# Patient Record
Sex: Male | Born: 1960 | Race: White | Hispanic: No | Marital: Single | Smoking: Former smoker
Health system: Southern US, Community
[De-identification: ages and names within clinical notes are randomized; demographics above are authoritative.]

## PROBLEM LIST (undated history)

## (undated) DIAGNOSIS — F411 Generalized anxiety disorder: Secondary | ICD-10-CM

## (undated) DIAGNOSIS — I10 Essential (primary) hypertension: Secondary | ICD-10-CM

## (undated) DIAGNOSIS — G119 Hereditary ataxia, unspecified: Secondary | ICD-10-CM

## (undated) DIAGNOSIS — F988 Other specified behavioral and emotional disorders with onset usually occurring in childhood and adolescence: Secondary | ICD-10-CM

## (undated) DIAGNOSIS — E669 Obesity, unspecified: Secondary | ICD-10-CM

## (undated) DIAGNOSIS — K589 Irritable bowel syndrome without diarrhea: Secondary | ICD-10-CM

## (undated) DIAGNOSIS — N4403 Torsion of appendix testis: Secondary | ICD-10-CM

## (undated) HISTORY — PX: COLOSTOMY: SHX63

## (undated) HISTORY — DX: Essential (primary) hypertension: I10

## (undated) HISTORY — DX: Torsion of appendix testis: N44.03

## (undated) HISTORY — DX: Generalized anxiety disorder: F41.1

## (undated) HISTORY — DX: Other specified behavioral and emotional disorders with onset usually occurring in childhood and adolescence: F98.8

## (undated) HISTORY — DX: Irritable bowel syndrome, unspecified: K58.9

## (undated) HISTORY — DX: Obesity, unspecified: E66.9

---

## 1999-08-13 ENCOUNTER — Encounter: Payer: Self-pay | Admitting: Family Medicine

## 1999-08-13 ENCOUNTER — Encounter: Admission: RE | Admit: 1999-08-13 | Discharge: 1999-08-13 | Payer: Self-pay | Admitting: Family Medicine

## 2008-08-19 ENCOUNTER — Encounter: Admission: RE | Admit: 2008-08-19 | Discharge: 2008-08-19 | Payer: Self-pay | Admitting: Family Medicine

## 2008-11-08 ENCOUNTER — Encounter: Admission: RE | Admit: 2008-11-08 | Discharge: 2008-11-08 | Payer: Self-pay | Admitting: Neurology

## 2009-09-24 ENCOUNTER — Encounter: Payer: Self-pay | Admitting: Cardiology

## 2011-11-04 ENCOUNTER — Encounter: Payer: Self-pay | Admitting: *Deleted

## 2012-04-17 ENCOUNTER — Encounter: Payer: Self-pay | Admitting: Cardiology

## 2013-07-03 ENCOUNTER — Other Ambulatory Visit: Payer: Self-pay | Admitting: Neurology

## 2013-07-03 DIAGNOSIS — R519 Headache, unspecified: Secondary | ICD-10-CM

## 2013-07-03 DIAGNOSIS — R51 Headache: Principal | ICD-10-CM

## 2013-07-12 ENCOUNTER — Ambulatory Visit
Admission: RE | Admit: 2013-07-12 | Discharge: 2013-07-12 | Disposition: A | Discharge status: Home / Self Care | Payer: Medicare Other | Source: Ambulatory Visit | Attending: Neurology | Admitting: Neurology

## 2013-07-12 DIAGNOSIS — R519 Headache, unspecified: Secondary | ICD-10-CM

## 2013-07-12 DIAGNOSIS — R51 Headache: Principal | ICD-10-CM

## 2013-09-27 ENCOUNTER — Other Ambulatory Visit: Payer: Self-pay | Admitting: Pain Medicine

## 2013-09-27 DIAGNOSIS — M542 Cervicalgia: Secondary | ICD-10-CM

## 2013-09-27 DIAGNOSIS — M546 Pain in thoracic spine: Secondary | ICD-10-CM

## 2013-10-04 ENCOUNTER — Other Ambulatory Visit: Payer: Medicare Other

## 2013-10-07 ENCOUNTER — Ambulatory Visit
Admission: RE | Admit: 2013-10-07 | Discharge: 2013-10-07 | Disposition: A | Discharge status: Home / Self Care | Payer: Medicare Other | Source: Ambulatory Visit | Attending: Pain Medicine | Admitting: Pain Medicine

## 2013-10-07 ENCOUNTER — Encounter (INDEPENDENT_AMBULATORY_CARE_PROVIDER_SITE_OTHER): Payer: Self-pay

## 2013-10-07 DIAGNOSIS — M542 Cervicalgia: Secondary | ICD-10-CM

## 2013-10-07 DIAGNOSIS — M546 Pain in thoracic spine: Secondary | ICD-10-CM

## 2013-11-02 ENCOUNTER — Other Ambulatory Visit (HOSPITAL_COMMUNITY): Payer: Medicare Other

## 2013-11-12 ENCOUNTER — Other Ambulatory Visit (HOSPITAL_COMMUNITY): Payer: Self-pay | Admitting: Cardiology

## 2013-11-12 DIAGNOSIS — G459 Transient cerebral ischemic attack, unspecified: Secondary | ICD-10-CM

## 2013-11-15 ENCOUNTER — Other Ambulatory Visit (HOSPITAL_COMMUNITY): Payer: Self-pay | Admitting: Radiology

## 2013-11-15 ENCOUNTER — Ambulatory Visit (HOSPITAL_COMMUNITY): Payer: Medicare Other | Attending: Cardiovascular Disease | Admitting: Radiology

## 2013-11-15 ENCOUNTER — Ambulatory Visit (HOSPITAL_COMMUNITY): Payer: Medicare Other | Attending: Cardiovascular Disease | Admitting: Cardiology

## 2013-11-15 DIAGNOSIS — I10 Essential (primary) hypertension: Secondary | ICD-10-CM | POA: Insufficient documentation

## 2013-11-15 DIAGNOSIS — E785 Hyperlipidemia, unspecified: Secondary | ICD-10-CM | POA: Insufficient documentation

## 2013-11-15 DIAGNOSIS — R42 Dizziness and giddiness: Secondary | ICD-10-CM

## 2013-11-15 DIAGNOSIS — I6529 Occlusion and stenosis of unspecified carotid artery: Secondary | ICD-10-CM

## 2013-11-15 DIAGNOSIS — G459 Transient cerebral ischemic attack, unspecified: Secondary | ICD-10-CM

## 2013-11-15 NOTE — Progress Notes (Signed)
Carotid duplex complete 

## 2013-11-15 NOTE — Progress Notes (Signed)
Echocardiogram performed.  

## 2017-01-08 ENCOUNTER — Encounter (HOSPITAL_COMMUNITY): Payer: Self-pay

## 2017-01-08 ENCOUNTER — Emergency Department (HOSPITAL_COMMUNITY)
Admission: EM | Admit: 2017-01-08 | Discharge: 2017-01-08 | Disposition: A | Discharge status: Home / Self Care | Payer: Medicare Other | Attending: Emergency Medicine | Admitting: Emergency Medicine

## 2017-01-08 DIAGNOSIS — I10 Essential (primary) hypertension: Secondary | ICD-10-CM | POA: Insufficient documentation

## 2017-01-08 DIAGNOSIS — E876 Hypokalemia: Secondary | ICD-10-CM | POA: Diagnosis not present

## 2017-01-08 DIAGNOSIS — Z79899 Other long term (current) drug therapy: Secondary | ICD-10-CM | POA: Diagnosis not present

## 2017-01-08 DIAGNOSIS — Z87891 Personal history of nicotine dependence: Secondary | ICD-10-CM | POA: Insufficient documentation

## 2017-01-08 DIAGNOSIS — E860 Dehydration: Secondary | ICD-10-CM | POA: Diagnosis not present

## 2017-01-08 DIAGNOSIS — G119 Hereditary ataxia, unspecified: Secondary | ICD-10-CM | POA: Insufficient documentation

## 2017-01-08 DIAGNOSIS — T679XXA Effect of heat and light, unspecified, initial encounter: Secondary | ICD-10-CM | POA: Diagnosis not present

## 2017-01-08 HISTORY — DX: Hereditary ataxia, unspecified: G11.9

## 2017-01-08 LAB — BASIC METABOLIC PANEL
Anion gap: 7 (ref 5–15)
BUN: 10 mg/dL (ref 6–20)
CO2: 20 mmol/L — ABNORMAL LOW (ref 22–32)
Calcium: 8.4 mg/dL — ABNORMAL LOW (ref 8.9–10.3)
Chloride: 115 mmol/L — ABNORMAL HIGH (ref 101–111)
Creatinine, Ser: 1.21 mg/dL (ref 0.61–1.24)
GFR calc Af Amer: >60 mL/min (ref >60)
GFR calc non Af Amer: >60 mL/min (ref >60)
Glucose, Bld: 120 mg/dL — ABNORMAL HIGH (ref 65–99)
Potassium: 3.1 mmol/L — ABNORMAL LOW (ref 3.5–5.1)
Sodium: 142 mmol/L (ref 135–145)

## 2017-01-08 LAB — CBC WITH DIFFERENTIAL/PLATELET
Basophils Absolute: 0 10*3/uL (ref 0.0–0.1)
Basophils Relative: 0 %
Eosinophils Absolute: 0.2 10*3/uL (ref 0.0–0.7)
Eosinophils Relative: 2 %
HCT: 39.4 % (ref 39.0–52.0)
Hemoglobin: 13.1 g/dL (ref 13.0–17.0)
Lymphocytes Relative: 15 %
Lymphs Abs: 1.3 10*3/uL (ref 0.7–4.0)
MCH: 27.7 pg (ref 26.0–34.0)
MCHC: 33.2 g/dL (ref 30.0–36.0)
MCV: 83.3 fL (ref 78.0–100.0)
Monocytes Absolute: 0.6 10*3/uL (ref 0.1–1.0)
Monocytes Relative: 7 %
Neutro Abs: 6.4 10*3/uL (ref 1.7–7.7)
Neutrophils Relative %: 76 %
Platelets: 132 10*3/uL — ABNORMAL LOW (ref 150–400)
RBC: 4.73 MIL/uL (ref 4.22–5.81)
RDW: 14.4 % (ref 11.5–15.5)
WBC: 8.4 10*3/uL (ref 4.0–10.5)

## 2017-01-08 LAB — CK: Total CK: 108 U/L (ref 49–397)

## 2017-01-08 LAB — I-STAT TROPONIN, ED: Troponin i, poc: 0 ng/mL (ref 0.00–0.08)

## 2017-01-08 MED ORDER — SODIUM CHLORIDE 0.9 % IV BOLUS (SEPSIS)
1000.0000 mL | Freq: Once | INTRAVENOUS | Status: AC
  Administered 2017-01-08: 1000 mL via INTRAVENOUS

## 2017-01-08 MED ORDER — POTASSIUM CHLORIDE CRYS ER 20 MEQ PO TBCR
40.0000 meq | EXTENDED_RELEASE_TABLET | Freq: Once | ORAL | Status: AC
  Administered 2017-01-08: 40 meq via ORAL
  Filled 2017-01-08: qty 2

## 2017-01-08 NOTE — Discharge Instructions (Signed)
Please make sure you stay well hydrated.  Please take breaks while working outside.  You should be urinating every 2-3 hours and it should be light yellow/clear.    Please follow up with your primary care doctor today regarding your symptoms and ED visit.    If your symptoms happen again or if you have any concerns or new abnormal symptoms please return to the ED.

## 2017-01-08 NOTE — ED Triage Notes (Signed)
He states he feels "weak". He further tells us he was "working outside since 7:30 this morning". He states he has a "brain problem" and this causes him to fall frequently. He states he fell today and could not get up for a period of time; but that he did not get hurt. He arrives in E.D. In no distress, having rec'd. 2 liters of IVF cn route to hospital.

## 2017-01-08 NOTE — ED Provider Notes (Signed)
[facility]-EMERGENCY DEPT Provider Note   CSN: [id number] Arrival date & time: 01/08/17  1520     History   Chief Complaint Chief Complaint  Patient presents with  . Heat Exposure    HPI Victor GeneraDouglas D Perman is a 56 y.o. male With a history Of cerebellar ataxia, chronic headaches, who presents by EMS after feeling like his legs gave out while he was outside. He reports that he had been intermittently working outside in the heat and was outside when his legs gave out and he "spiraled down". He was treated by EMS in the field with 2 L of fluids which he reports improved his symptoms greatly. He reports that he was well when he woke up this morning, with no recent fevers of chills.  He reports he has had his legs give out on him like this multiple times before.  He denies striking his head, no LOC.  Reports he has no pain right now, feels like his legs are back to normal.  He reports his blood pressure is normally about 130/80.    HPI  Past Medical History:  Diagnosis Date  . Anxiety state, unspecified   . 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 Cerebellar ataxia (HCC)   . Essential hypertension, benign   . IBS (irritable bowel syndrome)   . Obesity, unspecified   . Torsion of appendix testis     Patient Active Problem List   Diagnosis Date Noted  . Cerebellar ataxia [facility](HCC)     Past Surgical History:  Procedure Laterality Date  . COLOSTOMY     x2, abdomen ostomy colostomy       Home Medications    Prior to Admission medications   Medication Sig Start Date End Date Taking? Authorizing Provider  diltiazem (DILACOR XR) 180 MG 24 hr capsule Take 180 mg by mouth daily.    [provider]  divalproex (DEPAKOTE) 500 MG DR tablet Take 1,000 mg by mouth at bedtime.    [provider]  eletriptan (RELPAX) 40 MG tablet One tablet by mouth at onset of headache. May repeat in 2 hours if headache persists or recurs. may repeat in 2 hours if necessary     [provider]  HYDROcodone-acetaminophen (LORTAB) 10-500 MG per tablet Take 1 tablet by mouth every 6 (six) hours as needed.    [provider]  zolpidem (AMBIEN) 10 MG tablet Take 10 mg by mouth at bedtime as needed.    [provider]    Family History Family History  Problem Relation Age of Onset  . Cancer Unknown   . Diabetes Unknown   . Hypertension Unknown     Social History Social History  Substance Use Topics  . Smoking status: Former Games developermoker  . Smokeless tobacco: Never Used  . Alcohol use No     Allergies   Alcohol-sulfur [sulfur]   Review of Systems Review of Systems  Constitutional: Negative for chills and fever.  HENT: Negative for ear pain and sore throat.   Eyes: Negative for pain and visual disturbance (Reports vision is blurry, says it is normal for him as he doesn't have on his glasses. ).  Respiratory: Negative for cough and shortness of breath.   Cardiovascular: Negative for chest pain and palpitations.  Gastrointestinal: Negative for abdominal pain and vomiting.  Genitourinary: Negative for dysuria and hematuria.  Musculoskeletal: Negative for arthralgias and back pain.  Skin: Negative for color change and rash.  Neurological: Positive for weakness (Legs  gave out). Negative for seizures, syncope and headaches.  All other systems reviewed and are negative.    Physical Exam Updated Vital Signs BP 112/62 (BP Location: Right Arm)   Pulse 71   Temp 98.2 F (36.8 C) (Oral)   Resp 20   SpO2 100%   Physical Exam  Constitutional: He appears well-developed and well-nourished.  HENT:  Head: Normocephalic and atraumatic.  Eyes: Conjunctivae are normal.  Neck: Neck supple.  Cardiovascular: Normal rate and regular rhythm.   No murmur heard. Pulmonary/Chest: Effort normal and breath sounds normal. No respiratory distress.  Abdominal: Soft. There is no tenderness.  Musculoskeletal: He exhibits no edema, tenderness or  deformity.  5/5 strength to flexion and extension to ankle, knee, hip, wrists, elbows, shoulders.   Neurological: He is alert.  Mental Status:  Alert, oriented, thought content appropriate, able to give a coherent history. Speech fluent without evidence of aphasia. Able to follow 2 step commands without difficulty.  Cranial Nerves:  II:  Peripheral visual fields grossly normal, pupils equal, round, reactive to light, vision is normally blurry for patient as he doesn't have his glasses on.  III,IV, VI: ptosis not present, extra-ocular motions intact bilaterally  V,VII: smile symmetric, facial light touch sensation equal VIII: hearing grossly normal to voice  X: uvula elevates symmetrically  XI: bilateral shoulder shrug symmetric and strong XII: midline tongue extension without fassiculations Motor:  Normal tone. 5/5 in upper and lower extremities bilaterally including strong and equal grip strength and dorsiflexion/plantar flexion Cerebellar: Abnormal finger-to-nose with bilateral upper extremities, patient states is his normal CV: distal pulses palpable throughout    Skin: Skin is warm and dry.  Psychiatric: He has a normal mood and affect.  Nursing note and vitals reviewed.    ED Treatments / Results  Labs (all labs ordered are listed, but only abnormal results are displayed) Labs Reviewed  BASIC METABOLIC PANEL - Abnormal; Notable for the following:       Result Value   Potassium 3.1 (*)    Chloride 115 (*)    CO2 20 (*)    Glucose, Bld 120 (*)    Calcium 8.4 (*)    All other components within normal limits  CBC WITH DIFFERENTIAL/PLATELET - Abnormal; Notable for the following:    Platelets 132 (*)    All other components within normal limits  CK  I-STAT TROPONIN, ED    EKG  EKG Interpretation  Date/Time:  Saturday January 08 2017 16:02:18 EDT Ventricular Rate:  80 PR Interval:    QRS Duration: 98 QT Interval:  345 QTC Calculation: 398 R Axis:   52 Text  Interpretation:  Sinus rhythm Prolonged PR interval Abnormal R-wave progression, early transition Borderline T abnormalities, inferior leads Confirmed by Raeford RazorKohut, Stephen [phone]([id number]) on 01/08/2017 4:08:26 PM       Radiology No results found.  Procedures Procedures (including critical care time)  Medications Ordered in ED Medications  sodium chloride 0.9 % bolus 1,000 mL (0 mLs Intravenous Stopped 01/08/17 1905)  potassium chloride SA (K-DUR,KLOR-CON) CR tablet 40 mEq (40 mEq Oral Given 01/08/17 1703)     Initial Impression / Assessment and Plan / ED Course  I have reviewed the triage vital signs and the nursing notes.  Pertinent labs & imaging results that were available during my care of the patient were reviewed by me and considered in my medical decision making (see chart for details).  Clinical Course as of Jan 08 1918  Sat Jan 08, 2017  1741  Re-checked patient, says he feels better.  Vitals are improved.  Informed patient once he is able to urinate will  discharge him.   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  He reports he fells fine and is ready to go home.  He was given strict return precautions and states his understanding. He was instructed to follow up with his PCP.   [EH]    Clinical Course User Index [EH] Cristina Gong, PA-C   Victor [name] presents after having his legs give out while working in the heat.  He was initially hypotensive in the field and received 2 liters normal saline PTA.  He remained Hypotensive and I gave him a third liter of fluid.  He did not strike his head or loose consciousness and is not complaining of any pain at time of my evaluation.  Neruo exam normal for patient. Labs were obtained and reviewed, potassium was repleated.  After third liter and PO fluids patient was able to urinate.  Patient stated he was ready to go home.  Patient still remains asymptomatic.  He was given strict discharge precautions and states  his understanding.  Patient was given the option to ask questions, all of which were answered to the best of my ability.   At this time there does not appear to be any evidence of an acute emergency medical condition and the patient appears stable for discharge with appropriate outpatient follow up.Diagnosis was discussed with patient who verbalizes understanding and is agreeable to discharge. Pt case discussed with Dr. Fredderick Phenix who agrees with my plan.     Final Clinical Impressions(s) / ED Diagnoses   Final diagnoses:  Dehydration  Heat exposure, initial encounter  Hypokalemia    New Prescriptions New Prescriptions   No medications on file     Norman Clay 01/08/17 Ardis Rowan, MD 01/08/17 [phone]

## 2017-01-08 NOTE — ED Notes (Signed)
He remains in no distress; and his family are with him. He is oriented x 4 with somewhat slurred speech, which he states is his norm. He takes his med without difficulty.

## 2017-03-10 ENCOUNTER — Encounter: Payer: Self-pay | Admitting: Neurology

## 2017-05-16 ENCOUNTER — Ambulatory Visit: Payer: Medicare Other | Admitting: Neurology

## 2017-05-16 ENCOUNTER — Encounter: Payer: Self-pay | Admitting: Neurology

## 2017-05-16 VITALS — BP 92/56 | HR 85 | Ht 75.0 in | Wt 247.4 lb

## 2017-05-16 DIAGNOSIS — G44229 Chronic tension-type headache, not intractable: Secondary | ICD-10-CM

## 2017-05-16 DIAGNOSIS — G118 Other hereditary ataxias: Secondary | ICD-10-CM

## 2017-05-16 MED ORDER — ZONISAMIDE 100 MG PO CAPS: 100.0000 mg | ORAL_CAPSULE | Freq: Every day | ORAL | 2 refills | Status: DC

## 2017-05-16 NOTE — Progress Notes (Signed)
NEUROLOGY CONSULTATION NOTE  Victor [name] MRN: [id number] DOB: [birth year]  Referring provider: Charmayne SheerEric Moser, MD Primary care provider: Benedetto GoadFred Wilson, MD  Reason for consult:  papilledema  HISTORY OF PRESENT ILLNESS: Victor [name] Gravely is a 56 year old right-handed male with migraines, ADD, hypertension, depression/anxiety, fibromyalgia, IBS, tremor, and anxiety who presents for papilledema.  History supplemented by prior neurologist's notes.  MRI and MRA of brain from 07/12/13 were personally reviewed.  He has multiple medical issues.  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  His maternal grandfather had similar symptoms.  He has resting tremor, which was treated with Sinemet. He has orthostatic hypotension, which is treated with Florinef. He has chronic pain. He has chronic migraines, described as bi-frontal/occipital, constant dull pressure with episodic severe headaches.  Rarely they are associated with nausea.  He takes tramadol daily.  He also takes Depakote 1000mg  daily.  Past medications tried include nortriptyline (liver), topiramate, gabapentin, Cymbalta, cyporheptadine, verapamil and Botox. He has fatigue and is diagnosed with OSA. He was reportedly found to have possible papilledema on exam by an optometrist. He has headache and endorsed pulsatile tinnitus.  He underwent lumbar puncture on 09/22/16, which demonstrated opening pressure of 19.6 with no change in gait afterwards.  CSF cell count was 0, protein 76, glucose 74, negative VDRL, negative HSV and negative cytology.  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  MRV of the head from 01/06/17 showed a 2 cm segment of stenosis in the straight sinus.  He was started on Diamox 250mg  twice daily.  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  He has had multiple MRIs and testing over the years.  MRI of brain with and without contrast from 08/24/12, 07/12/13 and 07/04/15 reportedly showed T2 white matter hyperintensities within the bilateral cerebellar peduncles and mildly increased signal in the pons and posterior body of the corpus callosum, stable since 2011.  MRA of head from 07/12/13 was negative.    MRI of cervical spine without contrast from 02/23/14 showed right paracentral disc bulge at C5-C6 and left paracentral and lateral disc bulge at C6-C7 with no evidence of nerve root impingement.  Vitamin [name] was 31.5.  B12 in January 2017 was 109.  He was supplemented and repeat testing from that June was 330.  CK was 53.  TSH was 2.43.  PAST MEDICAL HISTORY: Past Medical History:  Diagnosis Date  . Anxiety state, unspecified   . 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 Cerebellar ataxia (HCC)   . Essential hypertension, benign   . IBS (irritable bowel syndrome)   . Obesity, unspecified   . Torsion of appendix testis     PAST SURGICAL HISTORY: Past Surgical History:  Procedure Laterality Date  . COLOSTOMY     x2, abdomen ostomy colostomy    MEDICATIONS: Current Outpatient Medications on File Prior to Visit  Medication Sig Dispense Refill  . acetaZOLAMIDE (DIAMOX) 250 MG tablet Take 1 tablet by mouth 2 (two) times daily.    . cyanocobalamin (,VITAMIN B-12,) 1000 MCG/ML injection Inject 1 mL into the muscle every 30 (thirty) days.    . fludrocortisone (FLORINEF) 0.1 MG tablet Take 0.1 mg by mouth as directed.    . pregabalin (LYRICA) 150 MG capsule Take 150 mg by mouth 2 (two) times daily.    .Marland Kitchen  traZODone (DESYREL) 100 MG tablet Take 100 mg by mouth at bedtime.    Marland Kitchen. diltiazem (DILACOR XR) 180 MG 24 hr capsule Take 180 mg by mouth daily.    . divalproex (DEPAKOTE) 500 MG DR tablet Take 1,000 mg by mouth at bedtime.    Marland Kitchen. eletriptan (RELPAX) 40 MG tablet One tablet by mouth at onset of headache.  May repeat in 2 hours if headache persists or recurs. may repeat in 2 hours if necessary    . HYDROcodone-acetaminophen (LORTAB) 10-500 MG per tablet Take 1 tablet by mouth every 6 (six) hours as needed.    . zolpidem (AMBIEN) 10 MG tablet Take 10 mg by mouth at bedtime as needed.     No current facility-administered medications on file prior to visit.     ALLERGIES: Allergies  Allergen Reactions  . Nortriptyline Other (See Comments)    Renal failure per pt- he told Dr Maple HudsonMoser that he could not pee? Prostrate issues.  Problems with kidneys and liver   . Alcohol Other (See Comments)    Blackouts/fainting  . Alcohol-Sulfur [Sulfur]   . Clonazepam   . Topiramate Other (See Comments)    Erectile dysfunction    FAMILY HISTORY: Family History  Problem Relation Age of Onset  . Cancer Unknown   . Diabetes Unknown   . Hypertension Unknown   . Hypertension Mother   . Anemia Mother   . Dementia Father   . Tremor Father     SOCIAL HISTORY: Social History   Socioeconomic History  . Marital status: Single    Spouse name: Not on file  . Number of children: 0  . Years of education: Not on file  . Highest education level: Some college, no degree  Social Needs  . Financial resource strain: Not on file  . Food insecurity - worry: Not on file  . Food insecurity - inability: Not on file  . Transportation needs - medical: Not on file  . Transportation needs - non-medical: Not on file  Occupational History  . Occupation: diasabled  Tobacco Use  . Smoking status: Former Games developermoker  . Smokeless tobacco: Never Used  Substance and Sexual Activity  . Alcohol use: No  . Drug use: No  . Sexual activity: Not on file  Other Topics Concern  . Not on file  Social History Narrative   Pt is single, lives with parents now in a one story home. Avoids caffeine, drinks mostly flavored water. 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     REVIEW OF SYSTEMS: Constitutional:  No fevers, chills, or sweats, no generalized fatigue, change in appetite Eyes: No visual changes, double vision, eye pain Ear, nose and throat: No hearing loss, ear pain, nasal congestion, sore throat Cardiovascular: No chest pain, palpitations Respiratory:  No shortness of breath at rest or with exertion, wheezes GastrointestinaI: No nausea, vomiting, diarrhea, abdominal pain, fecal incontinence Genitourinary:  No dysuria, urinary retention or frequency Musculoskeletal:  No neck pain, back pain Integumentary: No rash, pruritus, skin lesions Neurological: as above Psychiatric: No depression, insomnia, anxiety Endocrine: No palpitations, fatigue, diaphoresis, mood swings, change in appetite, change in weight, increased thirst Hematologic/Lymphatic:  No purpura, petechiae. Allergic/Immunologic: no itchy/runny eyes, nasal congestion, recent allergic reactions, rashes  PHYSICAL EXAM: Vitals:   05/16/17 1511  BP: (!) 92/56  Pulse: 85  SpO2: 96%   General: No acute distress.  Patient appears well-groomed.  Head:  Normocephalic/atraumatic Eyes:  fundi examined but not visualized  Neck: supple, no paraspinal tenderness, full range of motion Back: No paraspinal tenderness Heart: regular rate and rhythm Lungs: Clear to auscultation bilaterally. Vascular: No carotid bruits. Neurological Exam: Mental status: alert and oriented to person, place, and time, recent and remote memory intact, fund of knowledge intact, attention and concentration intact, speech fluent and not dysarthric, language intact. Cranial nerves: CN I: not tested CN II: pupils equal, round and reactive to light, visual fields intact CN III, IV, VI:  full range of motion, no nystagmus, no ptosis CN V: facial sensation intact CN VII: upper and lower face symmetric CN VIII: hearing intact CN IX, X: gag intact, uvula midline CN XI: sternocleidomastoid and trapezius muscles intact CN XII: tongue midline Bulk & Tone: normal,  no fasciculations. Motor:  5/5 throughout  Sensation: temperature and vibration sensation intact.. Deep Tendon Reflexes:  2+ throughout, toes downgoing.  Finger to nose testing:  With resting and kinetic tremor. Heel to shin:  Without dysmetria.  Gait:  Slightly ataxic.  Romberg negative.  IMPRESSION: Chronic daily headache, possibly tension-type.  I do not think he has idiopathic intracranial hypertension. 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 Discontinue acetazolamide.  Start zonisamide 100mg  daily 2.  Limit tramadol to 2 days out of the week 3.  He will continue Depakote 1000mg  daily 4.  Follow up in 3 months.   Shon Millet, DO

## 2017-05-17 NOTE — Patient Instructions (Signed)
Stop acetazolamide and start zonisamide 100mg  daily.

## 2017-05-20 ENCOUNTER — Other Ambulatory Visit: Payer: Self-pay | Admitting: Physician Assistant

## 2017-05-20 ENCOUNTER — Ambulatory Visit
Admission: RE | Admit: 2017-05-20 | Discharge: 2017-05-20 | Disposition: A | Discharge status: Home / Self Care | Payer: Medicare Other | Source: Ambulatory Visit | Attending: Physician Assistant | Admitting: Physician Assistant

## 2017-05-20 DIAGNOSIS — R609 Edema, unspecified: Secondary | ICD-10-CM

## 2017-05-20 DIAGNOSIS — M79605 Pain in left leg: Secondary | ICD-10-CM

## 2017-07-07 ENCOUNTER — Other Ambulatory Visit: Payer: Self-pay | Admitting: *Deleted

## 2017-08-08 ENCOUNTER — Other Ambulatory Visit: Payer: Self-pay | Admitting: Neurology

## 2017-08-18 ENCOUNTER — Ambulatory Visit: Payer: Medicare Other | Admitting: Neurology

## 2017-08-18 ENCOUNTER — Encounter: Payer: Self-pay | Admitting: Neurology

## 2017-08-18 VITALS — BP 136/70 | HR 86 | Ht 75.0 in | Wt 249.0 lb

## 2017-08-18 DIAGNOSIS — G118 Other hereditary ataxias: Secondary | ICD-10-CM

## 2017-08-18 DIAGNOSIS — G44229 Chronic tension-type headache, not intractable: Secondary | ICD-10-CM | POA: Diagnosis not present

## 2017-08-18 MED ORDER — ZONISAMIDE 100 MG PO CAPS: 200.0000 mg | ORAL_CAPSULE | Freq: Every day | ORAL | 3 refills | Status: DC

## 2017-08-18 MED ORDER — VENLAFAXINE HCL ER 75 MG PO CP24: 75.0000 mg | ORAL_CAPSULE | Freq: Every day | ORAL | 0 refills | Status: DC

## 2017-08-18 NOTE — Patient Instructions (Addendum)
1.  Increase zonisamide 100mg  to two capsules (200mg ) daily. 2.  I will taper you off of venlafaxine but we need to do it slowly.  Stop 150mg  pills.  Take 75mg  daily for 4 weeks, then contact me and I will prescribe you 37.5mg  pill to take daily for another month and then stop. 3.  Limit tramadol to no more than 2 days out of month. 4.  Follow up in 3 months.

## 2017-08-18 NOTE — Progress Notes (Signed)
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  UPDATE: Acetazolamide was discontinued and he was started on zonisamide 100mg  daily.  Headaches improved.  They are severe but occur twice a week.  They last until he goes to sleep.  However, he waits to take tramadol.  He no longer takes tramadol daily (no more than twice a week)\  Taking:  zonisamide 100mg  daily, venlafaxine XR 150mg   Depression:  yes; Anxiety:  Yes.  Controlled.   HISTORY: He has multiple medical issues.  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  His maternal grandfather had similar symptoms.  He has resting tremor, which was treated with Sinemet. He has orthostatic hypotension, which is treated with Florinef. He has chronic pain. He has chronic migraines, described as bi-frontal/occipital, constant dull pressure with episodic severe headaches.  Rarely they are associated with nausea.  No specific aggravating or relieving factors.  Past medications tried include nortriptyline (liver), topiramate, gabapentin, Cymbalta, Depakote, cyporheptadine, verapamil and Botox.  He has fatigue and is diagnosed with OSA. He was reportedly found to have possible papilledema on exam by an optometrist. He has headache and endorsed pulsatile tinnitus.  He underwent lumbar puncture on 09/22/16, which demonstrated opening pressure of 19.6 with no change in gait afterwards.  CSF cell count was 0, protein 76, glucose 74, negative VDRL, negative HSV and negative cytology.  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  MRV of the head from 01/06/17 showed a 2 cm segment of  stenosis in the straight sinus.  He was started on Diamox 250mg  twice daily.  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   He has had multiple MRIs and testing over the years.  MRI of brain with and without contrast from 08/24/12, 07/12/13 and 07/04/15 reportedly showed T2 white matter hyperintensities within the bilateral cerebellar peduncles and mildly increased signal in the pons and posterior body of the corpus callosum, stable since 2011.  MRA of head from 07/12/13 was negative.    MRI of cervical spine without contrast from 02/23/14 showed right paracentral disc bulge at C5-C6 and left paracentral and lateral disc bulge at C6-C7 with no evidence of nerve root impingement.  Vitamin D was 31.5.  B12 in January 2017 was 109.  He was supplemented and repeat testing from that June was 330.  CK was 53.  TSH was 2.43.  PAST MEDICAL HISTORY: Past Medical History:  Diagnosis Date  . Anxiety state, unspecified   . 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 Cerebellar ataxia (HCC)   . Essential hypertension, benign   . IBS (irritable bowel syndrome)   . Obesity, unspecified   . Torsion of appendix testis     MEDICATIONS: Current Outpatient Medications on File Prior to Visit  Medication Sig Dispense Refill  . fludrocortisone (FLORINEF) 0.1 MG tablet Take 0.1 mg by mouth as directed.    . pregabalin (LYRICA) 150 MG capsule Take 150 mg by mouth 2 (two) times daily.    . promethazine (PHENERGAN) 25 MG tablet Take 25 mg by mouth every 6 (six) hours as needed for nausea or vomiting.    Marland Kitchen  traMADol (ULTRAM) 50 MG tablet Take 50 mg by mouth every 6 (six) hours as needed.    . traZODone (DESYREL) 100 MG tablet Take 100 mg by mouth at bedtime.     No current facility-administered medications on file prior to visit.     ALLERGIES: Allergies  Allergen Reactions  . Nortriptyline Other (See Comments)    Renal failure  per pt- he told Victor [name] that he could not pee? Prostrate issues.  Problems with kidneys and liver   . Alcohol Other (See Comments)    Blackouts/fainting  . Alcohol-Sulfur [Sulfur]   . Clonazepam   . Topiramate Other (See Comments)    Erectile dysfunction    FAMILY HISTORY: Family History  Problem Relation Age of Onset  . Cancer Unknown   . Diabetes Unknown   . Hypertension Unknown   . Hypertension Mother   . Anemia Mother   . Dementia Father   . Tremor Father     SOCIAL HISTORY: Social History   Socioeconomic History  . Marital status: Single    Spouse name: Not on file  . Number of children: 0  . Years of education: Not on file  . Highest education level: Some college, no degree  Social Needs  . Financial resource strain: Not on file  . Food insecurity - worry: Not on file  . Food insecurity - inability: Not on file  . Transportation needs - medical: Not on file  . Transportation needs - non-medical: Not on file  Occupational History  . Occupation: diasabled  Tobacco Use  . Smoking status: Former Games developer  . Smokeless tobacco: Never Used  Substance and Sexual Activity  . Alcohol use: No  . Drug use: No  . Sexual activity: Not on file  Other Topics Concern  . Not on file  Social History Narrative   Pt is single, lives with parents now in a one story home. Avoids caffeine, drinks mostly flavored water. Prior to being disabled, he worked in a Holiday representative and traveled.     REVIEW OF SYSTEMS: Constitutional: No fevers, chills, or sweats, no generalized fatigue, change in appetite Eyes: No visual changes, double vision, eye pain Ear, nose and throat: No hearing loss, ear pain, nasal congestion, sore throat Cardiovascular: No chest pain, palpitations Respiratory:  No shortness of breath at rest or with exertion, wheezes GastrointestinaI: No nausea, vomiting, diarrhea, abdominal pain, fecal incontinence Genitourinary:  No dysuria, urinary retention or  frequency Musculoskeletal:  No neck pain, back pain Integumentary: No rash, pruritus, skin lesions Neurological: as above Psychiatric: depression, anxiety Endocrine: No palpitations, fatigue, diaphoresis, mood swings, change in appetite, change in weight, increased thirst Hematologic/Lymphatic:  No purpura, petechiae. Allergic/Immunologic: no itchy/runny eyes, nasal congestion, recent allergic reactions, rashes  PHYSICAL EXAM: Vitals:   08/18/17 1125  BP: 136/70  Pulse: 86   General: No acute distress.  Patient appears well-groomed.   Head:  Normocephalic/atraumatic Eyes:  Fundi examined but not visualized Neck: supple, no paraspinal tenderness, full range of motion Heart:  Regular rate and rhythm Lungs:  Clear to auscultation bilaterally Back: No paraspinal tenderness Neurological Exam: Mental status: alert and oriented to person, place, and time, recent and remote memory intact, fund of knowledge intact, attention and concentration intact, speech fluent and not dysarthric, language intact. Cranial nerves:  II-XII intact.  Bulk & Tone: normal, no fasciculations. Motor:  5/5 throughout  Sensation: temperature and vibration sensation intact.. Deep Tendon Reflexes:  2+ throughout, toes downgoing.  Finger to nose testing:  With resting and kinetic tremor. Heel to shin:  Without dysmetria.  Gait:  Slightly ataxic.  Romberg negative.  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  We will increase zonisamide to 200mg  daily. 2.  Since ineffective, we will slowly taper off of venlafaxine.  He will take 75mg  daily for a month, then 37.5mg  daily for a month and then stop 3.  Limit tramadol to no more than 2 days out of the month 4.  Headache diary. 5.  Follow up in 3 months.  Shon MilletAdam Jaffe, DO  CC:  Benedetto GoadFred Wilson, MD

## 2017-11-23 ENCOUNTER — Ambulatory Visit: Payer: Medicare Other | Admitting: Neurology

## 2017-11-25 ENCOUNTER — Other Ambulatory Visit: Payer: Self-pay

## 2017-11-25 ENCOUNTER — Ambulatory Visit: Payer: Medicare Other | Admitting: Neurology

## 2017-11-25 ENCOUNTER — Encounter: Payer: Self-pay | Admitting: Neurology

## 2017-11-25 VITALS — BP 134/82 | HR 62 | Ht 72.0 in | Wt 253.0 lb

## 2017-11-25 DIAGNOSIS — R55 Syncope and collapse: Secondary | ICD-10-CM

## 2017-11-25 DIAGNOSIS — G44219 Episodic tension-type headache, not intractable: Secondary | ICD-10-CM | POA: Diagnosis not present

## 2017-11-25 DIAGNOSIS — G118 Other hereditary ataxias: Secondary | ICD-10-CM | POA: Diagnosis not present

## 2017-11-25 NOTE — Progress Notes (Signed)
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   UPDATE: Last visit, zonisamide was increased to 200mg  daily and venlafaxine was tapered off.  Headaches are improved.  He cannot tell me exact frequency.  He reports memory loss.  On Wednesday, he has blacked out time from 8 AM to 4 PM.  He was at home.  When he regained awareness, he was sitting in his chair.  He did not have incontinence or bite his tongue.   HISTORY: He has multiple medical issues.  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  His maternal grandfather had similar symptoms.  He has resting tremor, which was treated with Sinemet. He has orthostatic hypotension, which is treated with Florinef. He has chronic pain. He has chronic migraines, described as bi-frontal/occipital, constant dull pressure with episodic severe headaches.  Rarely they are associated with nausea.  No specific aggravating or relieving factors.  Past medications tried include nortriptyline (liver), topiramate, gabapentin, Cymbalta, Depakote, cyporheptadine, verapamil, acetazolamide, venlafaxine XR 150mg  and Botox.   He has fatigue and is diagnosed with OSA. He was reportedly found to have possible papilledema on exam by an optometrist. He has headache and endorsed pulsatile tinnitus.  He underwent lumbar puncture on 09/22/16, which demonstrated opening pressure of 19.6 with no change in gait afterwards.  CSF cell count was 0, protein 76, glucose 74, negative VDRL, negative HSV and negative cytology.  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  MRV of  the head from 01/06/17 showed a 2 cm segment of stenosis in the straight sinus.  He was started on Diamox 250mg  twice daily.  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   He has had multiple MRIs and testing over the years.  MRI of brain with and without contrast from 08/24/12, 07/12/13 and 07/04/15 reportedly showed T2 white matter hyperintensities within the bilateral cerebellar peduncles and mildly increased signal in the pons and posterior body of the corpus callosum, stable since 2011.  MRA of head from 07/12/13 was negative.    MRI of cervical spine without contrast from 02/23/14 showed right paracentral disc bulge at C5-C6 and left paracentral and lateral disc bulge at C6-C7 with no evidence of nerve root impingement.  Vitamin Victor [name] was 31.5.  B12 in January 2017 was 109.  He was supplemented and repeat testing from that June was 330.  CK was 53.  TSH was 2.43.  PAST MEDICAL HISTORY: Past Medical History:  Diagnosis Date  . Anxiety state, unspecified   . 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 Cerebellar ataxia (HCC)   . Essential hypertension, benign   . IBS (irritable bowel syndrome)   . Obesity, unspecified   . Torsion of appendix testis     MEDICATIONS: Current Outpatient Medications on File Prior to Visit  Medication Sig Dispense Refill  . fludrocortisone (FLORINEF) 0.1 MG tablet Take 0.1 mg by mouth as directed.    . pregabalin (LYRICA) 150 MG capsule Take 150 mg by mouth 2 (two) times daily.    . promethazine (PHENERGAN) 25 MG tablet Take 25 mg by mouth every 6 (  six) hours as needed for nausea or vomiting.    . traMADol (ULTRAM) 50 MG tablet Take 50 mg by mouth every 6 (six) hours as needed.    . traZODone (DESYREL) 100 MG tablet Take 100 mg by mouth at bedtime.    Marland Kitchen venlafaxine XR (EFFEXOR XR) 75 MG 24 hr capsule Take 1 capsule (75 mg total) by mouth daily with breakfast. 30 capsule 0  .  zonisamide (ZONEGRAN) 100 MG capsule Take 2 capsules (200 mg total) by mouth daily. 90 capsule 3   No current facility-administered medications on file prior to visit.     ALLERGIES: Allergies  Allergen Reactions  . Nortriptyline Other (See Comments)    Renal failure per pt- he told Dr Maple Hudson that he could not pee? Prostrate issues.  Problems with kidneys and liver   . Alcohol Other (See Comments)    Blackouts/fainting  . Alcohol-Sulfur [Sulfur]   . Clonazepam   . Topiramate Other (See Comments)    Erectile dysfunction    FAMILY HISTORY: Family History  Problem Relation Age of Onset  . Cancer Unknown   . Diabetes Unknown   . Hypertension Unknown   . Hypertension Mother   . Anemia Mother   . Dementia Father   . Tremor Father     SOCIAL HISTORY: Social History   Socioeconomic History  . Marital status: Single    Spouse name: Not on file  . Number of children: 0  . Years of education: Not on file  . Highest education level: Some college, no degree  Occupational History  . Occupation: diasabled  Social Needs  . Financial resource strain: Not on file  . Food insecurity:    Worry: Not on file    Inability: Not on file  . Transportation needs:    Medical: Not on file    Non-medical: Not on file  Tobacco Use  . Smoking status: Former Games developer  . Smokeless tobacco: Never Used  Substance and Sexual Activity  . Alcohol use: No  . Drug use: No  . Sexual activity: Not on file  Lifestyle  . Physical activity:    Days per week: 0 days    Minutes per session: 0 min  . Stress: Not on file  Relationships  . Social connections:    Talks on phone: Not on file    Gets together: Not on file    Attends religious service: Not on file    Active member of club or organization: Not on file    Attends meetings of clubs or organizations: Not on file    Relationship status: Not on file  . Intimate partner violence:    Fear of current or ex partner: Not on file    Emotionally  abused: Not on file    Physically abused: Not on file    Forced sexual activity: Not on file  Other Topics Concern  . Not on file  Social History Narrative   Pt is single, lives with parents now in a one story home. Avoids caffeine, drinks mostly flavored water. Prior to being disabled, he worked in a Holiday representative and traveled.     REVIEW OF SYSTEMS: Constitutional: No fevers, chills, or sweats, no generalized fatigue, change in appetite Eyes: No visual changes, double vision, eye pain Ear, nose and throat: No hearing loss, ear pain, nasal congestion, sore throat Cardiovascular: No chest pain, palpitations Respiratory:  No shortness of breath at rest or with exertion, wheezes GastrointestinaI: No nausea, vomiting, diarrhea,  abdominal pain, fecal incontinence Genitourinary:  No dysuria, urinary retention or frequency Musculoskeletal:  No neck pain, back pain Integumentary: No rash, pruritus, skin lesions Neurological: as above Psychiatric: No depression, insomnia, anxiety Endocrine: No palpitations, fatigue, diaphoresis, mood swings, change in appetite, change in weight, increased thirst Hematologic/Lymphatic:  No purpura, petechiae. Allergic/Immunologic: no itchy/runny eyes, nasal congestion, recent allergic reactions, rashes  PHYSICAL EXAM: Vitals:   11/25/17 1255  BP: 134/82  Pulse: 62  SpO2: 98%   General: No acute distress.  Patient appears well-groomed.   Head:  Normocephalic/atraumatic Eyes:  Fundi examined but not visualized Neck: supple, no paraspinal tenderness, full range of motion Heart:  Regular rate and rhythm Lungs:  Clear to auscultation bilaterally Back: No paraspinal tenderness Neurological Exam: Mental status: alert and oriented to person, place, and time, recent and remote memory intact, fund of knowledge intact, attention and concentration intact, speech fluent and not dysarthric, language intact. Cranial nerves:  II-XII intact.  Bulk & Tone: normal, no  fasciculations. Motor:  5/5 throughout.  Sensation: temperature and vibration sensation intact.. Deep Tendon Reflexes:  2+ throughout, toes downgoing.  Finger to nose testing:  With resting and kinetic tremor. Heel to shin:  Without dysmetria.  Gait:  Slightly ataxic.  Romberg negative.  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  Continue zonisamide 200mg  daily 2.  Check MRI of brain and EEG  3.  Follow up in 5 months.   Shon Millet, DO  CC:  Dr. Andrey Campanile

## 2017-11-25 NOTE — Patient Instructions (Addendum)
1.  Continue zonisamide 200mg  daily 2.  We will check MRI of brain with and without contrast. [facility] and they will call you directly to schedule your appt. They are located at [address] If you need to contact them directly please call [phone]. 3.  We will check EEG 4.  Follow up in 5 months

## 2017-11-28 ENCOUNTER — Ambulatory Visit (INDEPENDENT_AMBULATORY_CARE_PROVIDER_SITE_OTHER): Payer: Medicare Other | Admitting: Neurology

## 2017-11-28 DIAGNOSIS — R55 Syncope and collapse: Secondary | ICD-10-CM | POA: Diagnosis not present

## 2017-11-30 ENCOUNTER — Ambulatory Visit: Payer: Medicare Other | Admitting: Neurology

## 2017-12-08 NOTE — Progress Notes (Signed)
ELECTROENCEPHALOGRAM REPORT  Date of Study: 11/28/2017  Patient's Name: Victor [name] MRN: [id number] Date of Birth: 07/25/1960  Clinical History: 57 year old male with migraine and history of TIA presents for episode of black out.  Medications: FLORINE) 0.1 MG tablet   LYRICA 150 MG capsule PHENERGAN 25 MG tablet  ULTRAM 50 MG tablet  DESYREL 100 MG tablet   EFFEXOR XR 75 MG 24 hr capsule  ZONEGRAN 100 MG capsule   Technical Summary: A multichannel digital EEG recording measured by the international 10-20 system with electrodes applied with paste and impedances below 5000 ohms performed in our laboratory with EKG monitoring in an awake and asleep patient.  Hyperventilation and photic stimulation were performed.  The digital EEG was referentially recorded, reformatted, and digitally filtered in a variety of bipolar and referential montages for optimal display.    Description: The patient is awake and asleep during the recording.  During maximal wakefulness, there is a symmetric, medium voltage 9 Hz posterior dominant rhythm that attenuates with eye opening.  The record is symmetric.  During drowsiness and sleep, there is an increase in theta slowing of the background.  Stage 2 sleep was seen.  There were no epileptiform discharges or electrographic seizures seen.    EKG lead was unremarkable.  Impression: This awake and asleep EEG is normal.    Clinical Correlation: A normal EEG does not exclude a clinical diagnosis of epilepsy.  If further clinical questions remain, prolonged EEG may be helpful.  Clinical correlation is advised.   Shon MilletAdam Johari Bennetts, DO

## 2017-12-10 ENCOUNTER — Ambulatory Visit
Admission: RE | Admit: 2017-12-10 | Discharge: 2017-12-10 | Disposition: A | Discharge status: Home / Self Care | Payer: Medicare Other | Source: Ambulatory Visit | Attending: Neurology | Admitting: Neurology

## 2017-12-10 DIAGNOSIS — R55 Syncope and collapse: Secondary | ICD-10-CM

## 2017-12-10 MED ORDER — GADOBENATE DIMEGLUMINE 529 MG/ML IV SOLN
20.0000 mL | Freq: Once | INTRAVENOUS | Status: AC | PRN
  Administered 2017-12-10: 20 mL via INTRAVENOUS

## 2017-12-27 ENCOUNTER — Telehealth: Payer: Self-pay

## 2017-12-27 ENCOUNTER — Telehealth: Payer: Self-pay | Admitting: Neurology

## 2017-12-27 NOTE — Telephone Encounter (Signed)
Called and spoke with Pt. He had actually called the wrong office to confirm an appt.

## 2017-12-27 NOTE — Telephone Encounter (Signed)
Patient lmom returning your call. Thanks  °

## 2017-12-27 NOTE — Telephone Encounter (Signed)
-----   Message from Drema DallasAdam R Jaffe, DO sent at 12/11/2017  7:43 PM EDT ----- MRI reveals no changes since last study.  EEG is normal.  No further recommendations at this time

## 2017-12-27 NOTE — Telephone Encounter (Signed)
Called and LMOVM advising of MRI and EMG results, and to call with any questions or concerns.

## 2018-03-07 ENCOUNTER — Other Ambulatory Visit: Payer: Self-pay | Admitting: Neurology

## 2018-04-26 NOTE — Progress Notes (Signed)
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  UPDATE: Intensity:  Mild to moderate Duration:  1 to 2 days Frequency:  Every 3 or 4 weeks. Frequency of abortive medication: every 3 or 4 weeks Current NSAIDS:   Current analgesics: Tramadol 50 mg (does not usually take) Current triptans:  none Current ergotamine:  none Current anti-emetic: Promethazine 25 mg Current muscle relaxants:  none Current anti-anxiolytic:  none Current sleep aide: Trazodone 100 mg Current Antihypertensive medications:  none Current Antidepressant medications:  none Current Anticonvulsant medications: Zonisamide 200 mg daily, Lyrica 200 mg twice daily Current anti-CGRP:  none Current Vitamins/Herbal/Supplements:  none Current Antihistamines/Decongestants:  none Other therapy:  none  On September 22, he noticed that he lost his sense of taste.  It followed a severe headache.  It lasted almost a week.  He took 20 tramadol.    He reports sense of smell has faded as well.  He denies colds or other upper respiratory infection.  However, he had a flu shot 2 weeks prior to this headache.    Caffeine:  1 cup of coffee daily Depression:  No; Anxiety:  No Hydration:  8 to 10 1/2 liter bottles of water daily Other pain:  Chronic pain Sleep hygiene:  Varies.  To evaluate episode of blackout, he had normal awake and asleep EEG on 11/28/2017 and MRI of brain with and without contrast on 12/11/2017 which was personally reviewed and again demonstrated stable abnormal signal and volume loss in the middle cerebellar peduncles and pons but no acute intracranial abnormality.  HISTORY: He has multiple medical issues.  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  His maternal grandfather had similar symptoms.  He has resting tremor, which was treated with Sinemet. He has orthostatic hypotension, which is treated with Florinef. He has chronic pain. He has chronic migraines, described as bi-frontal/occipital, constant dull pressure with episodic severe headaches.  Rarely they are associated with nausea.  No specific aggravating or relieving factors.  Past medications tried include nortriptyline (liver), topiramate, gabapentin, Cymbalta, Depakote, cyporheptadine, verapamil, acetazolamide, venlafaxine XR 150mg  and Botox.  He has fatigue and is diagnosed with OSA. He was reportedly found to have possible papilledema on exam by an optometrist. He has headache and endorsed pulsatile tinnitus.  He underwent lumbar puncture on 09/22/16, which demonstrated opening pressure of 19.6 with no change in gait afterwards.  CSF cell count was 0, protein 76, glucose 74, negative VDRL, negative HSV and negative cytology.  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  MRV of the head from 01/06/17 showed a 2 cm segment of stenosis in the straight sinus.  He was started on Diamox 250mg  twice daily.  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  He has had multiple MRIs and testing over the years.  MRI of brain with and without contrast from 08/24/12, 07/12/13 and 07/04/15 reportedly showed T2 white matter hyperintensities within the bilateral cerebellar peduncles and mildly increased signal in the pons and posterior body of the corpus callosum, stable since 2011.  MRA of head from 07/12/13 was negative.  MRI of cervical spine without contrast from 02/23/14 showed right paracentral disc bulge at C5-C6 and left paracentral and lateral disc bulge at C6-C7 with no  evidence of nerve root impingement.  Vitamin D was 31.5.  B12 in January 2017 was 109.  He was supplemented and repeat testing from that June was 330.  CK was 53.  TSH was 2.43.  PAST MEDICAL HISTORY: Past Medical History:  Diagnosis Date  . Anxiety state, unspecified   . 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 Cerebellar ataxia (HCC)   . Essential hypertension, benign   . IBS (irritable bowel syndrome)   . Obesity, unspecified   . Torsion of appendix testis     MEDICATIONS: Current Outpatient Medications on File Prior to Visit  Medication Sig Dispense Refill  . fludrocortisone (FLORINEF) 0.1 MG tablet Take 0.1 mg by mouth as directed.    . pregabalin (LYRICA) 150 MG capsule Take 150 mg by mouth 2 (two) times daily.    . promethazine (PHENERGAN) 25 MG tablet Take 25 mg by mouth every 6 (six) hours as needed for nausea or vomiting.    . traMADol (ULTRAM) 50 MG tablet Take 50 mg by mouth every 6 (six) hours as needed.    . traZODone (DESYREL) 100 MG tablet Take 100 mg by mouth at bedtime.    Marland Kitchen venlafaxine XR (EFFEXOR XR) 75 MG 24 hr capsule Take 1 capsule (75 mg total) by mouth daily with breakfast. 30 capsule 0  . zonisamide (ZONEGRAN) 100 MG capsule TAKE 2 CAPSULES (200 MG TOTAL) BY MOUTH DAILY. 180 capsule 1   No current facility-administered medications on file prior to visit.     ALLERGIES: Allergies  Allergen Reactions  . Nortriptyline Other (See Comments)    Renal failure per pt- he told Dr Maple Hudson that he could not pee? Prostrate issues.  Problems with kidneys and liver   . Alcohol Other (See Comments)    Blackouts/fainting  . Alcohol-Sulfur [Sulfur]   . Clonazepam   . Topiramate Other (See Comments)    Erectile dysfunction    FAMILY HISTORY: Family History  Problem Relation Age of Onset  . Cancer Unknown   . Diabetes Unknown   . Hypertension Unknown   . Hypertension Mother   . Anemia Mother   . Dementia Father   . Tremor Father     SOCIAL HISTORY: Social History   Socioeconomic History  . Marital status: Single    Spouse name: Not on file  . Number of children: 0  . Years of education: Not on file  . Highest education level: Some college, no degree  Occupational History  . Occupation: diasabled  Social Needs  . Financial resource strain: Not on file  . Food insecurity:    Worry: Not on file    Inability: Not on file  . Transportation needs:    Medical: Not on file    Non-medical: Not on file  Tobacco Use  . Smoking status: Former Games developer  . Smokeless tobacco: Never Used  Substance and Sexual Activity  . Alcohol use: No  . Drug use: No  . Sexual activity: Not on file  Lifestyle  . Physical activity:    Days per week: 0 days    Minutes per session: 0 min  . Stress: Not on file  Relationships  . Social connections:    Talks on phone: Not on file    Gets together: Not on file    Attends religious service:  Not on file    Active member of club or organization: Not on file    Attends meetings of clubs or organizations: Not on file    Relationship status: Not on file  . Intimate partner violence:    Fear of current or ex partner: Not on file    Emotionally abused: Not on file    Physically abused: Not on file    Forced sexual activity: Not on file  Other Topics Concern  . Not on file  Social History Narrative   Pt is single, lives with parents now in a one story home. Avoids caffeine, drinks mostly flavored water. 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     REVIEW OF SYSTEMS: Constitutional: No fevers, chills, or sweats, no generalized fatigue, change in appetite Eyes: No visual changes, double vision, eye pain Ear, nose and throat: No hearing loss, ear pain, nasal congestion, sore throat Cardiovascular: No chest pain, palpitations Respiratory:  No shortness of breath at rest or with exertion, wheezes GastrointestinaI: No nausea, vomiting, diarrhea, abdominal pain,  fecal incontinence Genitourinary:  No dysuria, urinary retention or frequency Musculoskeletal:  No neck pain, back pain Integumentary: No rash, pruritus, skin lesions Neurological: as above Psychiatric: No depression, insomnia, anxiety Endocrine: No palpitations, fatigue, diaphoresis, mood swings, change in appetite, change in weight, increased thirst Hematologic/Lymphatic:  No purpura, petechiae. Allergic/Immunologic: no itchy/runny eyes, nasal congestion, recent allergic reactions, rashes  PHYSICAL EXAM: Blood pressure 138/80, pulse 73, height 6' (1.829 m), weight 258 lb (117 kg), SpO2 98 %. General: No acute distress.  Patient appears well-groomed.   Head:  Normocephalic/atraumatic Eyes:  Fundi examined but not visualized Neck: supple, no paraspinal tenderness, full range of motion Heart:  Regular rate and rhythm Lungs:  Clear to auscultation bilaterally Back: No paraspinal tenderness Neurological Exam: alert and oriented to person, place, and time. Attention span and concentration intact, recent and remote memory intact, fund of knowledge intact.  Speech fluent and not dysarthric, language intact.  CN II-XII intact. Bulk and tone normal, muscle strength 5/5 throughout.  Mild chin tremor.  Sensation to pinprick and vibration intact.  Deep tendon reflexes 2+ throughout, toes downgoing.  Finger to nose testing with postural and kinetic tremor.  Gait slightly ataxic, Romberg negative.  IMPRESSION: 1.  Anosmia 2.  New severe intractable headache in patient over 50 3.  Episodic tension type headache, not intractable 4.  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  Transient altered awareness.  No recurrent spells.  Etiology unknown.  Neurologic work-up negative.  PLAN: 1.  Since he has persistent loss of taste and smell since experiencing a new severe intractable headache, we need to repeat MRI of brain with and without contrast to evaluate for intracranial etiology. 2.  He will continue  zonisamide 200mg  daily 3.  Limit use of pain relievers to no more than 2 days out of week to prevent risk of rebound or medication-overuse headache. 4.  Follow up in 4 months.  Shon MilletAdam Timoteo Carreiro, DO  CC: Benedetto GoadFred Wilson, MD

## 2018-04-27 ENCOUNTER — Encounter: Payer: Self-pay | Admitting: Neurology

## 2018-04-27 ENCOUNTER — Ambulatory Visit: Payer: Medicare Other | Admitting: Neurology

## 2018-04-27 VITALS — BP 138/80 | HR 73 | Ht 72.0 in | Wt 258.0 lb

## 2018-04-27 DIAGNOSIS — R519 Headache, unspecified: Secondary | ICD-10-CM

## 2018-04-27 DIAGNOSIS — R430 Anosmia: Secondary | ICD-10-CM

## 2018-04-27 DIAGNOSIS — G44219 Episodic tension-type headache, not intractable: Secondary | ICD-10-CM

## 2018-04-27 DIAGNOSIS — R51 Headache: Secondary | ICD-10-CM

## 2018-04-27 DIAGNOSIS — G118 Other hereditary ataxias: Secondary | ICD-10-CM | POA: Diagnosis not present

## 2018-04-27 NOTE — Patient Instructions (Addendum)
1.  We will check another MRI of brain with and without contrast 2.  Follow up with your sleep specialist for reevaluation of CPAP 3.  Limit use of pain relievers to no more than 2 days out of week to prevent risk of rebound or medication-overuse headache. 4.  Keep headache diary 5.  Follow up in 4 months.  [facility] and they will call you directly to schedule your appt. They are located at [address] If you need to contact them directly please call [phone].

## 2018-04-27 NOTE — Addendum Note (Signed)
Addended by: Dorthy CoolerBURNS, Isiaha Greenup J on: 04/27/2018 11:37 AM   Modules accepted: Orders

## 2018-05-08 ENCOUNTER — Ambulatory Visit
Admission: RE | Admit: 2018-05-08 | Discharge: 2018-05-08 | Disposition: A | Discharge status: Home / Self Care | Payer: Medicare Other | Source: Ambulatory Visit | Attending: Neurology | Admitting: Neurology

## 2018-05-08 DIAGNOSIS — R51 Headache: Secondary | ICD-10-CM

## 2018-05-08 DIAGNOSIS — R430 Anosmia: Secondary | ICD-10-CM

## 2018-05-08 DIAGNOSIS — R519 Headache, unspecified: Secondary | ICD-10-CM

## 2018-05-08 MED ORDER — GADOBENATE DIMEGLUMINE 529 MG/ML IV SOLN
20.0000 mL | Freq: Once | INTRAVENOUS | Status: AC | PRN
  Administered 2018-05-08: 20 mL via INTRAVENOUS

## 2018-05-09 ENCOUNTER — Telehealth: Payer: Self-pay | Admitting: *Deleted

## 2018-05-09 ENCOUNTER — Encounter: Payer: Self-pay | Admitting: *Deleted

## 2018-05-09 NOTE — Telephone Encounter (Signed)
Results sent via My Chart.  

## 2018-05-09 NOTE — Telephone Encounter (Signed)
-----   Message from Vivien RotaAshley H Ethon Wymer, LPN sent at 09/81/191411/26/2019 11:32 AM EST -----   ----- Message ----- From: Drema DallasJaffe, Adam R, DO Sent: 05/09/2018  10:23 AM EST To: Dorthy CoolerSandra J Burns, CMA  MRI does not reveal anything concerning.  There is some mild sinusitis in the frontal sinuses but nothing significant.

## 2018-07-31 IMAGING — MR MR HEAD WO/W CM
12 series · 48 of 48 positions shown · IV contrast (multihance)
Comparison: 07/12/2013

CLINICAL DATA: Transient loss of consciousness

EXAM:
MRI HEAD WITHOUT AND WITH CONTRAST
TECHNIQUE: Multiplanar, multiecho pulse sequences of the brain and surrounding
structures were obtained without and with intravenous contrast.
CONTRAST:  20mL MULTIHANCE GADOBENATE DIMEGLUMINE 529 MG/ML IV SOLN

[Series 2: T1 · sagittal · 5.0mm · 0.45mm/px · 2 of 23 slices shown]
[im 1/23]
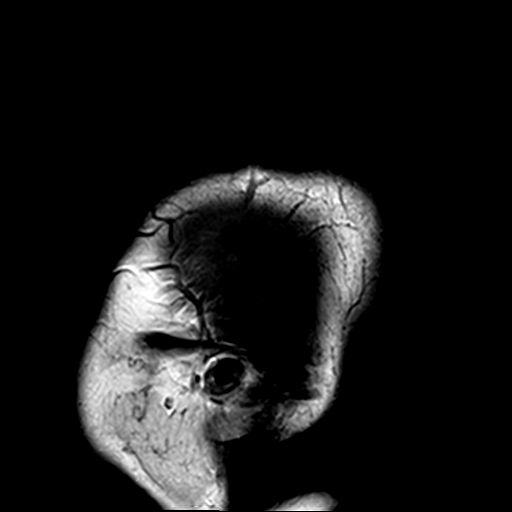
[im 23/23]
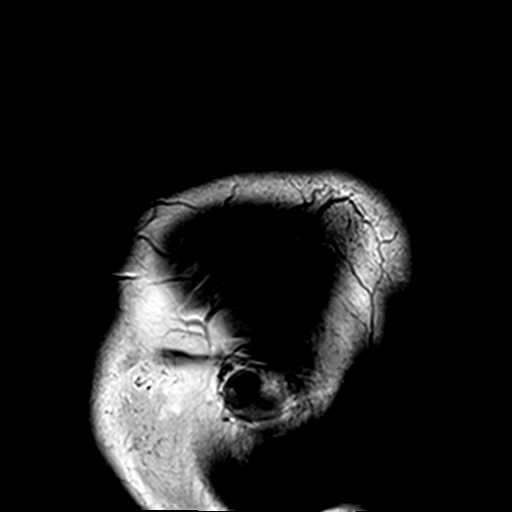

[Series 3: DWI · axial · 3.0mm · 1.80mm/px · z∈[-54,+101]mm · 7 of 106 slices shown (1 of 4)]
[im 1/106]
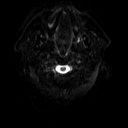
[im 18/106]
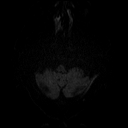
[im 36/106]
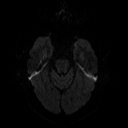
[im 53/106]
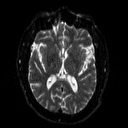
[im 71/106]
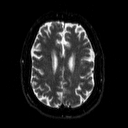
[im 88/106]
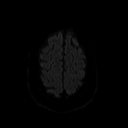
[im 106/106]
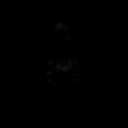

[Series 4: DWI · axial · 3.0mm · 1.80mm/px · z∈[-54,+101]mm · 3 of 53 slices shown (2 of 4)]
[im 1/53]
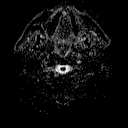
[im 27/53]
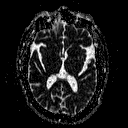
[im 53/53]
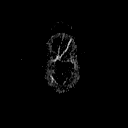

[Series 5: DWI · coronal · 5.0mm · 1.80mm/px · 5 of 80 slices shown (3 of 4)]
[im 1/80]
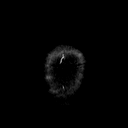
[im 20/80]
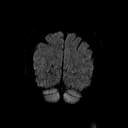
[im 40/80]
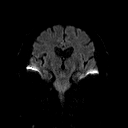
[im 60/80]
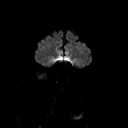
[im 80/80]
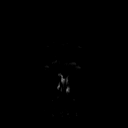

[Series 6: DWI · coronal · 5.0mm · 1.80mm/px · 3 of 40 slices shown (4 of 4)]
[im 1/40]
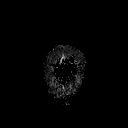
[im 20/40]
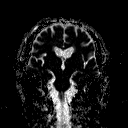
[im 40/40]
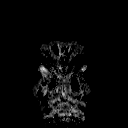

[Series 7: T2 · axial · 5.0mm · 0.51mm/px · z∈[-49,+105]mm · 2 of 24 slices shown (1 of 2)]
[im 1/24]
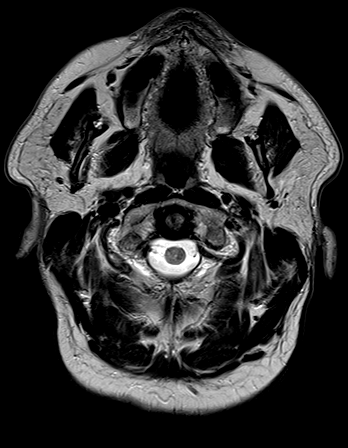
[im 24/24]
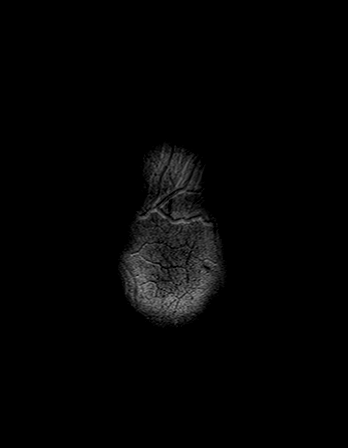

[Series 8: FLAIR · axial · 3.0mm · 0.45mm/px · z∈[-53,+100]mm · 2 of 34 slices shown]
[im 1/34]
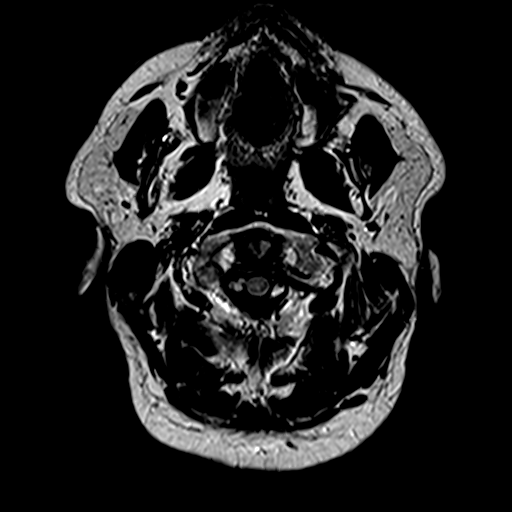
[im 34/34]
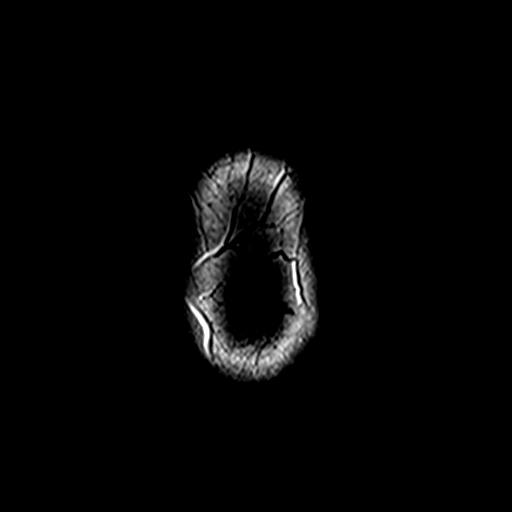

[Series 10: swi_images · axial · 5.0mm · 0.90mm/px · z∈[-57,+97]mm · 2 of 32 slices shown]
[im 1/32]
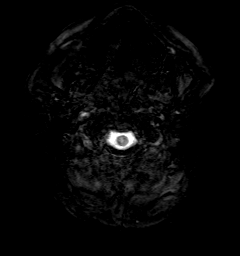
[im 32/32]
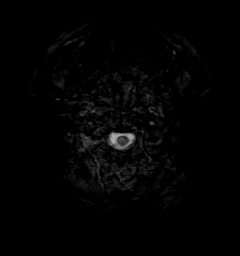

[Series 11: t1_mpr_tra · axial · 1.0mm · 0.75mm/px · z∈[-48,+100]mm · 9 of 144 slices shown (1 of 2)]
[im 1/144]
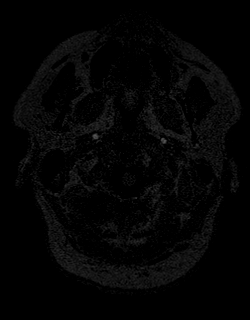
[im 18/144]
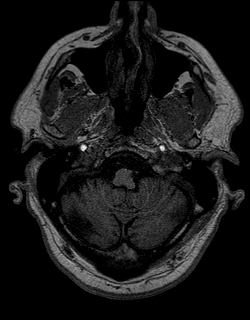
[im 36/144]
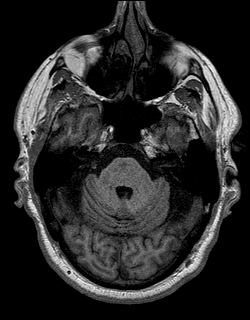
[im 54/144]
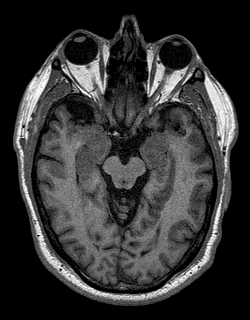
[im 72/144]
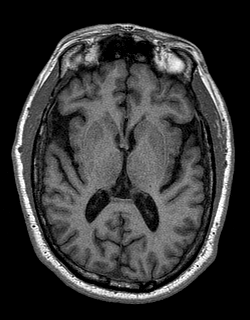
[im 90/144]
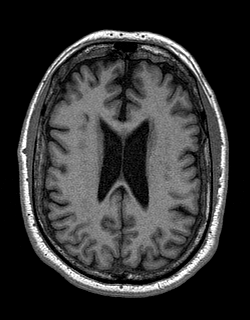
[im 108/144]
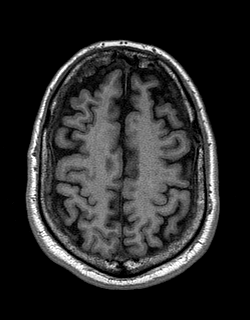
[im 126/144]
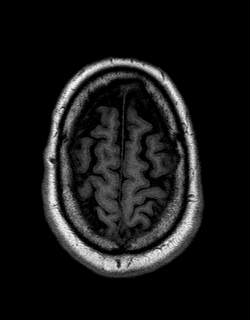
[im 144/144]
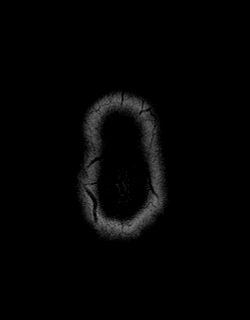

[Series 12: T2 · coronal · 5.0mm · 0.45mm/px · 2 of 30 slices shown (2 of 2)]
[im 1/30]
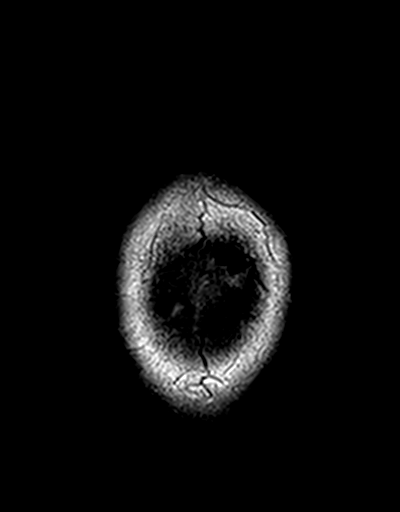
[im 30/30]
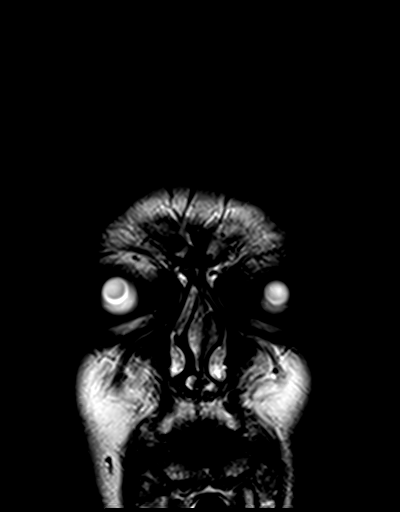

[Series 13: t1_mpr_tra · axial · 1.0mm · 0.75mm/px · z∈[-48,+100]mm · 9 of 144 slices shown (2 of 2)]
[im 1/144]
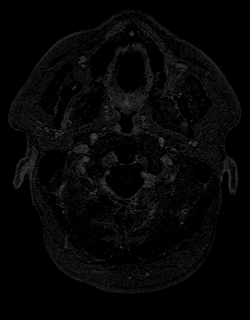
[im 18/144]
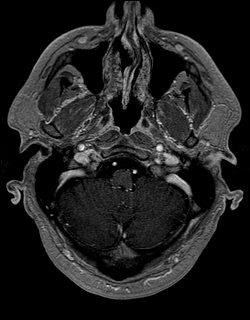
[im 36/144]
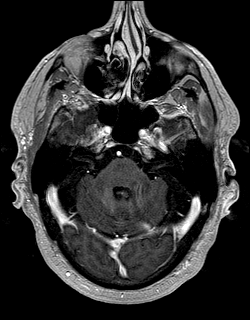
[im 54/144]
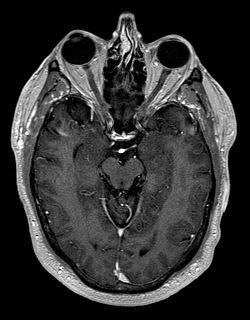
[im 72/144]
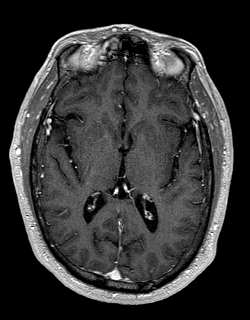
[im 90/144]
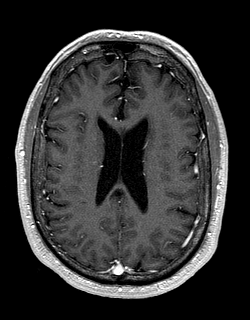
[im 108/144]
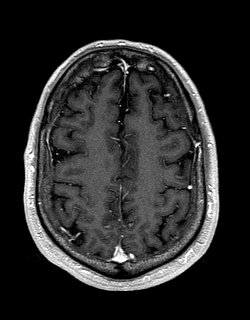
[im 126/144]
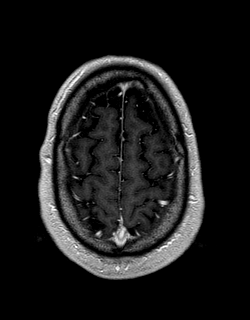
[im 144/144]
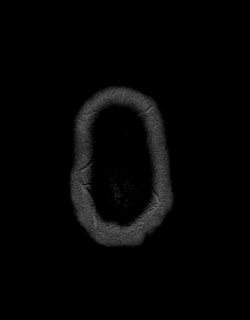

[Series 14: post cor · coronal · 5.0mm · 0.45mm/px · 2 of 30 slices shown]
[im 1/30]
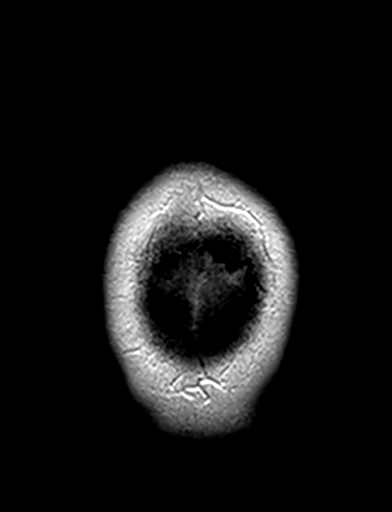
[im 30/30]
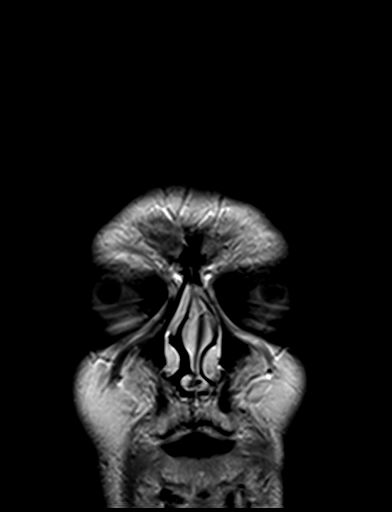

[48 of 48 positions shown; findings below may reference images not displayed]

FINDINGS: Brain: Chronic symmetric T2 hyperintensity within thinned middle
cerebellar peduncles and to a lesser degree in the mildly atrophic
pons. This has been seen over multiple years. Patient has presumed
spinal cerebellar ataxia. Mild periventricular and inferior corpus
callosum surface FLAIR hyperintensity, nonspecific. No acute
infarct, hemorrhage, hydrocephalus, or collection. Stable normal
cerebral volume.

Vascular: Major flow voids and vascular enhancements are preserved

Skull and upper cervical spine: No evidence of marrow lesion

Sinuses/Orbits: Negative
IMPRESSION: 1. Stable compared to 5776.  No explanation for recent episode.
2. History of presumed spinal cerebellar ataxia with stable signal
abnormality and volume loss in the middle cerebellar peduncles and
pons.

## 2018-09-06 ENCOUNTER — Ambulatory Visit: Payer: Medicare Other | Admitting: Neurology

## 2018-09-07 ENCOUNTER — Other Ambulatory Visit: Payer: Self-pay | Admitting: Neurology

## 2018-12-20 NOTE — Progress Notes (Signed)
Virtual Visit via Video Note The purpose of this virtual visit is to provide medical care while limiting exposure to the novel coronavirus.    Consent was obtained for video visit:  Yes.   Answered questions that patient had about telehealth interaction:  Yes.   I discussed the limitations, risks, security and privacy concerns of performing an evaluation and management service by telemedicine. I also discussed with the patient that there may be a patient responsible charge related to this service. The patient expressed understanding and agreed to proceed.  Pt location: Home Physician Location: Home Name of referring provider:  Christain Sacramento, MD I connected with Victor [name] at patients initiation/request on 12/21/2018 at  9:50 AM EDT by video enabled telemedicine application and verified that I am speaking with the correct person using two identifiers. 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  UPDATE: Last visit, he endorsed new intractable headaches as well as loss of smell.  MRI of brain with and without contrast on 05/09/18 demonstrated deviated septum but patent nasal cavity, some bilateral frontal sinusitis and stable findings consistent with cerebellar ataxia but no new or acute abnormalities to explaini loss of sense of smell.  He had a severe headache for 3 weeks that aborted 1 week ago.  Otherwise, they remain well-controlled. Intensity:  Mild to moderate Duration:  1 to 2 days Frequency:  Every 3 or 4 weeks Frequency of abortive medication: every 3 or 4 weeks Current NSAIDS:   Current analgesics: Tramadol 50 mg (does not usually take) Current  triptans:  none Current ergotamine:  none Current anti-emetic: Promethazine 25 mg Current muscle relaxants:  none Current anti-anxiolytic:  none Current sleep aide: Trazodone 100 mg Current Antihypertensive medications:  none Current Antidepressant medications:  none Current Anticonvulsant medications: Zonisamide 200 mg daily, Lyrica 200 mg twice daily Current anti-CGRP:  none Current Vitamins/Herbal/Supplements:  none Current Antihistamines/Decongestants:  none Other therapy:  none  No black out spells.  Caffeine:  1 cup of coffee daily Depression:  No; Anxiety:  No Hydration:  8 to 10 1/2 liter bottles of water daily Other pain:  Chronic pain Sleep hygiene:  Varies.  HISTORY: He has multiple medical issues. 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 His maternal grandfather had similar symptoms. He has resting tremor, which was treated with Sinemet. He has orthostatic hypotension, which is treated with Florinef. He has chronic pain. He has chronic migraines, described as bi-frontal/occipital, constant dull pressure with episodic severe headaches. Rarely they are associated with nausea. No specific aggravating or relieving factors. Past medications tried include nortriptyline (liver), topiramate, gabapentin, Cymbalta, Depakote, cyporheptadine, verapamil, acetazolamide, venlafaxine XR 150mg  and Botox.  He has fatigue and is diagnosed with OSA. He was reportedly found to have possible papilledema on exam by an optometrist. He has headache and endorsed pulsatile tinnitus. He underwent lumbar puncture on 09/22/16, which demonstrated opening pressure of 19.6 with no change in gait afterwards. CSF cell count was 0, protein 76, glucose 74, negative VDRL, negative HSV and negative cytology. 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 MRV of the head from  01/06/17 showed a  2 cm segment of stenosis in the straight sinus. He was started on Diamox 250mg  twice daily. 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  He has had multiple MRIs and testing over the years. MRI of brain with and without contrast from 08/24/12, 07/12/13 and 07/04/15 reportedly showed T2 white matter hyperintensities within the bilateral cerebellar peduncles and mildly increased signal in the pons and posterior body of the corpus callosum, stable since 2011. MRA of head from 07/12/13 was negative. MRI of cervical spine without contrast from 02/23/14 showed right paracentral disc bulge at C5-C6 and left paracentral and lateral disc bulge at C6-C7 with no evidence of nerve root impingement. Vitamin D was 31.5. B12 in January 2017 was 109. He was supplemented and repeat testing from that June was 330. CK was 53. TSH was 2.43.  On 03/05/18, he noticed that he lost his sense of taste.  It followed a severe headache.  It lasted almost a week.  He took 20 tramadol.    He reports sense of smell has faded as well.  He denies colds or other upper respiratory infection.  However, he had a flu shot 2 weeks prior to this headache.    To evaluate episode of blackout, he had normal awake and asleep EEG on 11/28/2017 and MRI of brain with and without contrast on 12/11/2017 which was personally reviewed and again demonstrated stable abnormal signal and volume loss in the middle cerebellar peduncles and pons but no acute intracranial abnormality.  Past medications:  Venlafaxine  Past Medical History: Past Medical History:  Diagnosis Date  . Anxiety state, unspecified   . 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 Cerebellar ataxia (HCC)   . Essential hypertension, benign   . IBS (irritable bowel syndrome)   . Obesity, unspecified   . Torsion of appendix testis     Medications: Outpatient Encounter  Medications as of 12/21/2018  Medication Sig  . fludrocortisone (FLORINEF) 0.1 MG tablet Take 0.1 mg by mouth as directed.  . pregabalin (LYRICA) 150 MG capsule Take 150 mg by mouth 2 (two) times daily.  . promethazine (PHENERGAN) 25 MG tablet Take 25 mg by mouth every 6 (six) hours as needed for nausea or vomiting.  . traMADol (ULTRAM) 50 MG tablet Take 50 mg by mouth every 6 (six) hours as needed.  . traZODone (DESYREL) 100 MG tablet Take 100 mg by mouth at bedtime.  Marland Kitchen. venlafaxine XR (EFFEXOR XR) 75 MG 24 hr capsule Take 1 capsule (75 mg total) by mouth daily with breakfast. (Patient not taking: Reported on 04/27/2018)  . zonisamide (ZONEGRAN) 100 MG capsule TAKE 2 CAPSULES BY MOUTH EVERY DAY   No facility-administered encounter medications on file as of 12/21/2018.     Allergies: Allergies  Allergen Reactions  . Nortriptyline Other (See Comments)    Renal failure per pt- he told Dr Maple HudsonMoser that he could not pee? Prostrate issues.  Problems with kidneys and liver   . Alcohol Other (See Comments)    Blackouts/fainting  . Alcohol-Sulfur [Sulfur]   . Clonazepam   . Topiramate Other (See Comments)    Erectile dysfunction    Family History: Family History  Problem Relation Age of Onset  . Cancer Unknown   . Diabetes Unknown   . Hypertension Unknown   . Hypertension Mother   . Anemia Mother   . Dementia Father   . Tremor Father  Social History: Social History   Socioeconomic History  . Marital status: Single    Spouse name: Not on file  . Number of children: 0  . Years of education: Not on file  . Highest education level: Some college, no degree  Occupational History  . Occupation: diasabled  Social Needs  . Financial resource strain: Not on file  . Food insecurity    Worry: Not on file    Inability: Not on file  . Transportation needs    Medical: Not on file    Non-medical: Not on file  Tobacco Use  . Smoking status: Former Games developermoker  . Smokeless tobacco: Never  Used  Substance and Sexual Activity  . Alcohol use: No  . Drug use: No  . Sexual activity: Not on file  Lifestyle  . Physical activity    Days per week: 0 days    Minutes per session: 0 min  . Stress: Not on file  Relationships  . Social Musicianconnections    Talks on phone: Not on file    Gets together: Not on file    Attends religious service: Not on file    Active member of club or organization: Not on file    Attends meetings of clubs or organizations: Not on file    Relationship status: Not on file  . Intimate partner violence    Fear of current or ex partner: Not on file    Emotionally abused: Not on file    Physically abused: Not on file    Forced sexual activity: Not on file  Other Topics Concern  . Not on file  Social History Narrative   Pt is single, lives with parents now in a one story home. Avoids caffeine, drinks mostly flavored water. 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     Observations/Objective:   Blood pressure 111/77, pulse 89, height 6\' 3"  (1.905 m), weight 164 lb (74.4 kg). No acute distress.  Alert and oriented.  Speech fluent and not dysarthric.  Language intact.  Eyes orthophoric on primary gaze.  Face symmetric.  Assessment and Plan:   1.  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  Episodic tension type headache, not intractabel 3.  Transient altered awareness.  No recurrent spells.  Negative neurologic workup. 4.  Anosmia.  No identifiable intracranial cause.  Possibly secondary to SCA.  1.  For preventative management, zonisamide 200mg  daily 2. Limit use of pain relievers to no more than 2 days out of week to prevent risk of rebound or medication-overuse headache. 3.  Keep headache diary 4.  Encouraged to remain active and routinely exercise 5. Follow up in one year  Follow Up Instructions:    -I discussed the assessment and treatment plan with the patient. The patient was provided an opportunity to ask questions and all  were answered. The patient agreed with the plan and demonstrated an understanding of the instructions.   The patient was advised to call back or seek an in-person evaluation if the symptoms worsen or if the condition fails to improve as anticipated.    Cira ServantAdam Robert Jaffe, DO

## 2018-12-21 ENCOUNTER — Other Ambulatory Visit: Payer: Self-pay

## 2018-12-21 ENCOUNTER — Encounter: Payer: Self-pay | Admitting: Neurology

## 2018-12-21 ENCOUNTER — Telehealth (INDEPENDENT_AMBULATORY_CARE_PROVIDER_SITE_OTHER): Payer: Medicare Other | Admitting: Neurology

## 2018-12-21 VITALS — BP 111/77 | HR 89 | Ht 75.0 in | Wt 164.0 lb

## 2018-12-21 DIAGNOSIS — G118 Other hereditary ataxias: Secondary | ICD-10-CM

## 2018-12-21 DIAGNOSIS — R430 Anosmia: Secondary | ICD-10-CM

## 2018-12-21 DIAGNOSIS — R55 Syncope and collapse: Secondary | ICD-10-CM

## 2018-12-21 DIAGNOSIS — G44219 Episodic tension-type headache, not intractable: Secondary | ICD-10-CM

## 2019-06-01 ENCOUNTER — Other Ambulatory Visit: Payer: Self-pay | Admitting: Neurology

## 2019-06-01 NOTE — Telephone Encounter (Signed)
Requested Prescriptions   Pending Prescriptions Disp Refills  . zonisamide (ZONEGRAN) 100 MG capsule [Pharmacy Med Name: ZONISAMIDE 100 MG CAPSULE] 180 capsule 1    Sig: TAKE 2 CAPSULES BY MOUTH EVERY DAY   Rx last filled:09/07/18 #180 1 refills  Pt last seen:12/21/18  Follow up appt scheduled:12/24/19

## 2019-08-20 ENCOUNTER — Other Ambulatory Visit: Payer: Self-pay | Admitting: Physician Assistant

## 2019-08-20 DIAGNOSIS — M79662 Pain in left lower leg: Secondary | ICD-10-CM

## 2019-08-21 ENCOUNTER — Ambulatory Visit
Admission: RE | Admit: 2019-08-21 | Discharge: 2019-08-21 | Disposition: A | Discharge status: Home / Self Care | Payer: Medicare Other | Source: Ambulatory Visit | Attending: Physician Assistant | Admitting: Physician Assistant

## 2019-08-21 ENCOUNTER — Other Ambulatory Visit: Payer: Self-pay

## 2019-08-21 DIAGNOSIS — M79662 Pain in left lower leg: Secondary | ICD-10-CM

## 2019-09-12 ENCOUNTER — Other Ambulatory Visit: Payer: Self-pay | Admitting: Pain Medicine

## 2019-09-12 ENCOUNTER — Ambulatory Visit
Admission: RE | Admit: 2019-09-12 | Discharge: 2019-09-12 | Disposition: A | Discharge status: Home / Self Care | Payer: Medicare Other | Source: Ambulatory Visit | Attending: Pain Medicine | Admitting: Pain Medicine

## 2019-09-12 DIAGNOSIS — M25511 Pain in right shoulder: Secondary | ICD-10-CM

## 2019-11-23 ENCOUNTER — Other Ambulatory Visit: Payer: Self-pay | Admitting: Neurology

## 2019-12-20 NOTE — Progress Notes (Signed)
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  UPDATE: He reports that headaches have gradually gotten worse.  They have been daily and persistent over the past 4 months, more severe 3 to 4 days out of the week.  He attributes it to the arthritic pain in his shoulder.  He takes tramadol 3 days a week. Frequency of abortive medication:3 days a week Current NSAIDS:  Current analgesics:Tramadol 50 mg (does not usually take) Current triptans:none Current ergotamine:none Current anti-emetic:Promethazine 25 mg Current muscle relaxants:none Current anti-anxiolytic:none Current sleep aide:Trazodone 100 mg Current Antihypertensive medications:none Current Antidepressant medications:none Current Anticonvulsant medications:Zonisamide 200 mg daily, Lyrica 200 mg twice daily Current anti-CGRP:none Current Vitamins/Herbal/Supplements:none Current Antihistamines/Decongestants:none Other therapy:none  No black out spells.  Caffeine:1 cup of coffee daily Depression:No;Anxiety: No Hydration: 8 to 10 1/2 liter bottles of water daily Other pain:Chronic pain Sleep hygiene:Varies.  HISTORY: He has multiple medical issues. 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 His maternal grandfather had similar symptoms. He has resting tremor, which was treated with Sinemet. He has orthostatic hypotension, which is treated with Florinef. He has chronic pain. He has chronic migraines, described as bi-frontal/occipital, constant dull pressure with episodic severe headaches. Rarely they are associated with nausea. No specific  aggravating or relieving factors.  Past medications tried include nortriptyline (liver), topiramate, gabapentin, Cymbalta, Depakote, cyproheptadine, verapamil, acetazolamide, venlafaxine XR 150mg  and Botox.  He has fatigue and is diagnosed with OSA. He was reportedly found to have possible papilledema on exam by an optometrist. He has headache and endorsed pulsatile tinnitus. He underwent lumbar puncture on 09/22/16, which demonstrated opening pressure of 19.6 with no change in gait afterwards. CSF cell count was 0, protein 76, glucose 74, negative VDRL, negative HSV and negative cytology. 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 MRV of the head from 01/06/17 showed a 2 cm segment of stenosis in the straight sinus. He was started on Diamox 250mg  twice daily. 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  He has had multiple MRIs and testing over the years. MRI of brain with and without contrast from 08/24/12, 07/12/13 and 07/04/15 reportedly showed T2 white matter hyperintensities within the bilateral cerebellar peduncles and mildly increased signal in the pons and posterior body of the corpus callosum, stable since 2011. MRA of head from 07/12/13 was negative. MRI of cervical spine without contrast from 02/23/14 showed right paracentral disc bulge at C5-C6 and left paracentral and lateral disc bulge at C6-C7 with no evidence of nerve root impingement. Vitamin D was 31.5. B12 in January 2017 was 109. He was supplemented and repeat testing from that June was 330. CK was 53. TSH was 2.43.   To evaluate episode of blackout, he hadnormal awake and asleep EEG on 11/28/2017 andMRI of brainwith and without contrast on 12/11/2017 which again demonstrated stable abnormal signal and volume loss in the middle cerebellar peduncles and pons but no acute intracranial abnormality.  On  03/05/18, he noticed that he lost his sense of taste. It followed a severe headache. It lasted almost a week. He took 20 tramadol. He reports sense of smell has faded as well. He denies colds or other  upper respiratory infection. However, he had a flu shot 2 weeks prior to this headache.  MRI of brain with and without contrast on 05/09/18 demonstrated deviated septum but patent nasal cavity, some bilateral frontal sinusitis and stable findings consistent with cerebellar ataxia but no new or acute abnormalities to explaini loss of sense of smell.  PAST MEDICAL HISTORY: Past Medical History:  Diagnosis Date   Anxiety state, unspecified    Attention deficit disorder without mention of hyperactivity    Cerebellar ataxia (HCC)    Essential hypertension, benign    IBS (irritable bowel syndrome)    Obesity, unspecified    Torsion of appendix testis     MEDICATIONS: Current Outpatient Medications on File Prior to Visit  Medication Sig Dispense Refill   fludrocortisone (FLORINEF) 0.1 MG tablet Take 0.1 mg by mouth as directed.     pregabalin (LYRICA) 150 MG capsule Take 150 mg by mouth 2 (two) times daily.     promethazine (PHENERGAN) 25 MG tablet Take 25 mg by mouth every 6 (six) hours as needed for nausea or vomiting.     traMADol (ULTRAM) 50 MG tablet Take 50 mg by mouth every 6 (six) hours as needed.     traZODone (DESYREL) 100 MG tablet Take 100 mg by mouth at bedtime.     venlafaxine XR (EFFEXOR XR) 75 MG 24 hr capsule Take 1 capsule (75 mg total) by mouth daily with breakfast. (Patient not taking: Reported on 04/27/2018) 30 capsule 0   zonisamide (ZONEGRAN) 100 MG capsule TAKE 2 CAPSULES BY MOUTH EVERY DAY 60 capsule 0   No current facility-administered medications on file prior to visit.    ALLERGIES: Allergies  Allergen Reactions   Nortriptyline Other (See Comments)    Renal failure per pt- he told Dr Maple Hudson that he could not pee? Prostrate issues.  Problems with  kidneys and liver    Alcohol Other (See Comments)    Blackouts/fainting   Alcohol-Sulfur [Sulfur]    Clonazepam    Topiramate Other (See Comments)    Erectile dysfunction    FAMILY HISTORY: Family History  Problem Relation Age of Onset   Cancer Other    Diabetes Other    Hypertension Other    Hypertension Mother    Anemia Mother    Dementia Father    Tremor Father     SOCIAL HISTORY: Social History   Socioeconomic History   Marital status: Single    Spouse name: Not on file   Number of children: 0   Years of education: Not on file   Highest education level: Some college, no degree  Occupational History   Occupation: diasabled  Tobacco Use   Smoking status: Former Smoker   Smokeless tobacco: Never Used  Building services engineer Use: Never used  Substance and Sexual Activity   Alcohol use: No   Drug use: No   Sexual activity: Not on file  Other Topics Concern   Not on file  Social History Narrative   Pt is single, lives with parents now in a one story home. Avoids caffeine, drinks mostly flavored water. Prior to being disabled, he worked in a Holiday representative and traveled.       Patient is right-handed.   Social Determinants of Health   Financial Resource Strain:    Difficulty of Paying Living Expenses:   Food Insecurity:    Worried About Programme researcher, broadcasting/film/video in the Last Year:    Barista in the  Last Year:   Transportation Needs:    Freight forwarder (Medical):    Lack of Transportation (Non-Medical):   Physical Activity:    Days of Exercise per Week:    Minutes of Exercise per Session:   Stress:    Feeling of Stress :   Social Connections:    Frequency of Communication with Friends and Family:    Frequency of Social Gatherings with Friends and Family:    Attends Religious Services:    Active Member of Clubs or Organizations:    Attends Engineer, structural:    Marital Status:   Intimate Partner  Violence:    Fear of Current or Ex-Partner:    Emotionally Abused:    Physically Abused:    Sexually Abused:     PHYSICAL EXAM: Blood pressure 127/87, pulse 72, resp. rate 18, height 6' (1.829 m), weight 262 lb (118.8 kg), SpO2 97 %. General: No acute distress.  Patient appears well-groomed.   Head:  Normocephalic/atraumatic Eyes:  Fundi examined but not visualized Neck: supple, no paraspinal tenderness, full range of motion Heart:  Regular rate and rhythm Lungs:  Clear to auscultation bilaterally Back: No paraspinal tenderness Neurological Exam: alert and oriented to person, place, and time. Attention span and concentration intact, recent and remote memory intact, fund of knowledge intact.  Speech fluent and not dysarthric, language intact.  CN II-XII intact. Bulk and tone normal, muscle strength 5/5 throughout, head tremor and resting and kinetic tremor in hands.  Sensation to light touch, temperature and vibration intact.  Deep tendon reflexes 2+ throughout, toes downgoing.  Finger to nose and heel to shin testing intact.  Slightly ataxic.  Romberg with sway  IMPRESSION: 1.  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  Chronic daily headache, may be migraine as severe episodes associated with nausea. 3.  Isolated episode of transient altered awareness.  No recurrent spells.  Negative neurologic workup.  PLAN: 1.  Increase zonisamide to 300mg  daily 2.  Tramadol as needed.  Limit use of pain relievers to no more than 2 days out of week to prevent risk of rebound or medication-overuse headache. 3.  On gabapentin and Lyrica for chronic pain syndrome 4.  Keep headache diary 5.  Follow up in 6 months  , DO  CC: Shon Millet, MD

## 2019-12-21 ENCOUNTER — Other Ambulatory Visit: Payer: Self-pay | Admitting: Neurology

## 2019-12-24 ENCOUNTER — Encounter: Payer: Self-pay | Admitting: Neurology

## 2019-12-24 ENCOUNTER — Other Ambulatory Visit: Payer: Self-pay

## 2019-12-24 ENCOUNTER — Ambulatory Visit (INDEPENDENT_AMBULATORY_CARE_PROVIDER_SITE_OTHER): Payer: Medicare Other | Admitting: Neurology

## 2019-12-24 VITALS — BP 127/87 | HR 72 | Resp 18 | Ht 72.0 in | Wt 262.0 lb

## 2019-12-24 DIAGNOSIS — G118 Other hereditary ataxias: Secondary | ICD-10-CM

## 2019-12-24 DIAGNOSIS — G43709 Chronic migraine without aura, not intractable, without status migrainosus: Secondary | ICD-10-CM

## 2019-12-24 MED ORDER — ZONISAMIDE 100 MG PO CAPS: 300.0000 mg | ORAL_CAPSULE | Freq: Every day | ORAL | 5 refills | Status: DC

## 2019-12-24 NOTE — Patient Instructions (Signed)
1.  Increase zonisamide to 3 capsules daily (300mg  daily) 2.  Limit use of pain relievers to no more than 2 days out of week to prevent risk of rebound or medication-overuse headache. 3.  Keep headache diary 4.  Follow up in 6 months

## 2020-05-16 ENCOUNTER — Telehealth: Payer: Self-pay | Admitting: Neurology

## 2020-05-16 NOTE — Telephone Encounter (Signed)
Pt advised of dr. Jaffe note 

## 2020-05-16 NOTE — Telephone Encounter (Signed)
I never prescribed his Lyrica.  He needs to find out who was filling his prescription.

## 2020-05-16 NOTE — Telephone Encounter (Signed)
Patient called to report that pain management wanted him to get the Pregabalin refilled from Dr Everlena Cooper. Patient uses [facility] in [place]

## 2020-06-23 NOTE — Progress Notes (Signed)
Virtual Visit via Video Note The purpose of this virtual visit is to provide medical care while limiting exposure to the novel coronavirus.    Consent was obtained for video visit:  Yes.   Answered questions that patient had about telehealth interaction:  Yes.   I discussed the limitations, risks, security and privacy concerns of performing an evaluation and management service by telemedicine. I also discussed with the patient that there may be a patient responsible charge related to this service. The patient expressed understanding and agreed to proceed.  Pt location: Home Physician Location: office Name of referring provider:  Barbie Banner, MD I connected with Victor [name] at patients initiation/request on 06/25/2020 at 10:10 AM EST by video enabled telemedicine application and verified that I am speaking with the correct person using two identifiers. 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  UPDATE: Increased zonisamide back in July Headaches are still daily, moderate, but severe 2 days a week and last 6-7 hours.   Frequency of abortive medication:2 days a week Current NSAIDS:  Current analgesics:Tramadol 50 mg (does not usually take - 2 days a week to sleep) Current triptans:none Current ergotamine:none Current anti-emetic:Promethazine 25 mg Current muscle relaxants:none Current anti-anxiolytic:none Current sleep aide:Trazodone 100 mg Current Antihypertensive medications:none Current Antidepressant medications:none Current Anticonvulsant medications:Zonisamide 300 mg daily, Lyrica 200 mg twice daily Current anti-CGRP:none Current Vitamins/Herbal/Supplements:none Current  Antihistamines/Decongestants:none Other therapy:none  No black out spells.  Caffeine:1 cup of coffee daily Depression:No;Anxiety: No Hydration: 8 to 10 1/2 liter bottles of water daily Other pain:Chronic pain Sleep hygiene:Varies.  HISTORY: He has multiple medical issues. 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 His maternal grandfather had similar symptoms. He has resting tremor, which was treated with Sinemet. He has orthostatic hypotension, which is treated with Florinef. He has chronic pain. He has chronic migraines, described as bi-frontal/occipital, constant dull pressure with episodic severe headaches. Rarely they are associated with nausea. No specific aggravating or relieving factors.  Past medications tried include nortriptyline (liver), topiramate, gabapentin, Cymbalta, Depakote, cyproheptadine, verapamil, acetazolamide, venlafaxine XR 150mg  and Botox.  He has fatigue and is diagnosed with OSA. He was reportedly found to have possible papilledema on exam by an optometrist. He has headache and endorsed pulsatile tinnitus. He underwent lumbar puncture on 09/22/16, which demonstrated opening pressure of 19.6 with no change in gait afterwards. CSF cell count was 0, protein 76, glucose 74, negative VDRL, negative HSV and negative cytology. 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 MRV of the head from 01/06/17 showed a 2 cm segment of stenosis in the straight sinus. He was started on Diamox 250mg  twice daily. 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  He has had multiple MRIs and testing over the years. MRI of brain with and without contrast from 08/24/12, 07/12/13 and 07/04/15 reportedly showed T2 white matter hyperintensities within the bilateral cerebellar  peduncles and mildly increased signal in the pons and posterior body of the corpus callosum, stable since 2011. MRA of head from 07/12/13 was negative. MRI of cervical spine without contrast from 02/23/14 showed right paracentral disc bulge at C5-C6  and left paracentral and lateral disc bulge at C6-C7 with no evidence of nerve root impingement. Vitamin D was 31.5. B12 in January 2017 was 109. He was supplemented and repeat testing from that June was 330. CK was 53. TSH was 2.43.  To evaluate episode of blackout, he hadnormal awake and asleep EEG on 11/28/2017 andMRI of brainwith and without contrast on 12/11/2017 which again demonstrated stable abnormal signal and volume loss in the middle cerebellar peduncles and pons but no acute intracranial abnormality.  On9/22/19, he noticed that he lost his sense of taste. It followed a severe headache. It lasted almost a week. He took 20 tramadol. He reports sense of smell has faded as well. He denies colds or other upper respiratory infection. However, he had a flu shot 2 weeks prior to this headache.  MRI of brain with and without contrast on 05/09/18 demonstrated deviated septum but patent nasal cavity, some bilateral frontal sinusitis and stable findings consistent with cerebellar ataxia but no new or acute abnormalities to explain loss of sense of smell.   Past Medical History: Past Medical History:  Diagnosis Date  . Anxiety state, unspecified   . 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 Cerebellar ataxia (HCC)   . Essential hypertension, benign   . IBS (irritable bowel syndrome)   . Obesity, unspecified   . Torsion of appendix testis     Medications: Outpatient Encounter Medications as of 06/25/2020  Medication Sig  . diclofenac Sodium (VOLTAREN) 1 % GEL SMARTSIG:4 Gram(s) Topical 3 Times Daily PRN  . fludrocortisone (FLORINEF) 0.1 MG tablet Take 0.1 mg by mouth as directed.  . pregabalin (LYRICA) 150 MG  capsule Take 200 mg by mouth 2 (two) times daily.   . promethazine (PHENERGAN) 25 MG tablet Take 25 mg by mouth every 6 (six) hours as needed for nausea or vomiting.  . traMADol (ULTRAM) 50 MG tablet Take 50 mg by mouth every 6 (six) hours as needed.  . traZODone (DESYREL) 100 MG tablet Take 100 mg by mouth at bedtime.  Marland Kitchen zonisamide (ZONEGRAN) 100 MG capsule Take 3 capsules (300 mg total) by mouth daily.  Marland Kitchen venlafaxine XR (EFFEXOR XR) 75 MG 24 hr capsule Take 1 capsule (75 mg total) by mouth daily with breakfast. (Patient not taking: No sig reported)   No facility-administered encounter medications on file as of 06/25/2020.    Allergies: Allergies  Allergen Reactions  . Nortriptyline Other (See Comments)    Renal failure per pt- he told Dr Maple Hudson that he could not pee? Prostrate issues.  Problems with kidneys and liver   . Alcohol Other (See Comments)    Blackouts/fainting  . Alcohol-Sulfur [Sulfur]   . Clonazepam   . Topiramate Other (See Comments)    Erectile dysfunction    Family History: Family History  Problem Relation Age of Onset  . Cancer Other   . Diabetes Other   . Hypertension Other   . Hypertension Mother   . Anemia Mother   . Dementia Father   . Tremor Father     Social History: Social History   Socioeconomic History  . Marital status: Single    Spouse name: Not on file  . Number of children: 0  . Years of education: Not on file  . Highest education level: Some college, no degree  Occupational History  . Occupation: diasabled  Tobacco Use  . Smoking status: Former Games developer  . Smokeless tobacco: Never Used  Vaping Use  . Vaping  Use: Never used  Substance and Sexual Activity  . Alcohol use: No  . Drug use: No  . Sexual activity: Not on file  Other Topics Concern  . Not on file  Social History Narrative   Pt is single, lives with parents now in a one story home. Avoids caffeine, drinks mostly flavored water. Prior to being disabled, he worked in a  Holiday representative and traveled.       Patient is right-handed.   Social Determinants of Health   Financial Resource Strain: Not on file  Food Insecurity: Not on file  Transportation Needs: Not on file  Physical Activity: Not on file  Stress: Not on file  Social Connections: Not on file  Intimate Partner Violence: Not on file    Observations/Objective:   Height 6\' 2"  (1.88 m), weight 268 lb (121.6 kg). No acute distress.  Alert and oriented.  Speech fluent and not dysarthric.  Language intact.    Assessment and Plan:   1. Chronic migraine without aura 2.  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  Isolated episode of transient altered awareness.  No recurrent spells.  Negative neurologic workup.  1.  Migraine prevention:  Stop zonisamide.  Start Emgality every 28 days. 2.  Tramadol for abortive management but limit to no more than 2 days out of week 3.  Keep headache diary 4.  Follow up 6 months.  Follow Up Instructions:    -I discussed the assessment and treatment plan with the patient. The patient was provided an opportunity to ask questions and all were answered. The patient agreed with the plan and demonstrated an understanding of the instructions.   The patient was advised to call back or seek an in-person evaluation if the symptoms worsen or if the condition fails to improve as anticipated.      , DO     Cira Servant, DO  CC: Shon Millet, MD

## 2020-06-25 ENCOUNTER — Encounter: Payer: Self-pay | Admitting: Neurology

## 2020-06-25 ENCOUNTER — Telehealth (INDEPENDENT_AMBULATORY_CARE_PROVIDER_SITE_OTHER): Payer: Medicare Other | Admitting: Neurology

## 2020-06-25 ENCOUNTER — Other Ambulatory Visit: Payer: Self-pay

## 2020-06-25 VITALS — Ht 74.0 in | Wt 268.0 lb

## 2020-06-25 DIAGNOSIS — G118 Other hereditary ataxias: Secondary | ICD-10-CM

## 2020-06-25 DIAGNOSIS — G43709 Chronic migraine without aura, not intractable, without status migrainosus: Secondary | ICD-10-CM

## 2020-06-25 MED ORDER — EMGALITY 120 MG/ML ~~LOC~~ SOAJ: 120.0000 mg | SUBCUTANEOUS | 5 refills | Status: DC

## 2020-06-25 MED ORDER — EMGALITY 120 MG/ML ~~LOC~~ SOAJ: 240.0000 mg | Freq: Once | SUBCUTANEOUS | 0 refills | Status: AC

## 2020-06-25 NOTE — Patient Instructions (Signed)
1.  Stop zonisamide 2.  Start [employer]  For first dose, give yourself 2 injections.  Then just one injection every 28 days 3.  Limit use of pain relievers to no more than 2 days out of week to prevent risk of rebound or medication-overuse headache. 4.  Keep headache diary 5.  Follow up 6 months

## 2020-06-30 ENCOUNTER — Encounter: Payer: Self-pay | Admitting: Neurology

## 2020-06-30 NOTE — Progress Notes (Signed)
Received fax approval for the Emgality valid from 06/25/20 to 06/13/21. PA #: [id number].

## 2020-07-12 ENCOUNTER — Other Ambulatory Visit: Payer: Self-pay | Admitting: Neurology

## 2020-09-29 ENCOUNTER — Telehealth: Payer: Self-pay | Admitting: Neurology

## 2020-09-29 MED ORDER — ZONISAMIDE 100 MG PO CAPS: ORAL_CAPSULE | ORAL | 2 refills | Status: DC

## 2020-09-29 NOTE — Telephone Encounter (Signed)
OK.  We can restart zonisamide 100mg  capsule - take 1 capsule daily for one week, then 2 capsules daily for one week, then 3 capsules daily.

## 2020-09-29 NOTE — Telephone Encounter (Signed)
Per pt he has three insurances. The one we checked was Part D. His medicare sent him a letteer of denial for the Emgality.    Please advise.

## 2020-09-29 NOTE — Telephone Encounter (Signed)
Pt advised Script sent to pharmacy.

## 2020-09-29 NOTE — Telephone Encounter (Signed)
For Emgality part only:  We have an approval on file for the [facility] medicare insurance: Received fax approval for the Emgality valid from 06/25/20 to 06/13/21. PA #: [id number].

## 2020-09-29 NOTE — Telephone Encounter (Signed)
Patient called in stating he needs a refill on his Zonisamide sent in to the [facility] in [place]

## 2020-12-29 NOTE — Progress Notes (Signed)
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  No recurrent spells with negative neurologic workup. OSA - uses CPAP but wakes up with difficulty breathing  1  Migraine prevention:  Taper off of zonisamide.  Start propranolol ER 60mg  daily.  Cautioned for lightheadedness.  We can increase dose to 80mg  daily in 4 weeks if needed. 2.  Limit use of pain relievers to no more than 2 days out of week to prevent risk of rebound or medication-overuse headache. 3.  Keep headache diary 4.  Refer to sleep medicine for re-evaluation of CPAP 5.  Follow up 6 months.  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   UPDATE: Changed from zonisamide to [facility] in January.  [facility] approved it, however Medicare denied it.  So he has remained on zonisamide. Headaches are still daily, moderate, but severe 2-3 days a week.  He attributes it to lack of sleep.  He is caring for his father with dementia who doesn't sleep well at night.     Frequency of abortive medication: 2 days a week Current NSAIDS:  None Current analgesics: Tramadol 50 mg (does not usually take - tries to avoid) Current triptans:  none Current ergotamine:  none Current anti-emetic: Promethazine 25 mg Current muscle relaxants:  none Current anti-anxiolytic:  none Current sleep aide: Trazodone 100 mg Current Antihypertensive medications:  none Current Antidepressant medications:  none Current Anticonvulsant medications: Zonisamide 300mg  daily, Lyrica 200 mg twice  daily Current anti-CGRP:  none Current Vitamins/Herbal/Supplements:  none Current Antihistamines/Decongestants:  none Other therapy:  none   No black out spells.   Caffeine:  1 cup of coffee daily Depression:  No; Anxiety:  No Hydration:  8 to 10 1/2 liter bottles of water daily Other pain:  Chronic pain Sleep hygiene:  Varies.  Has OSA.  Even with CPAP, he has had episodes of waking up and not breathing.     HISTORY: He has multiple medical issues.  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  His maternal grandfather had similar symptoms.  He has resting tremor, which was treated with Sinemet. He has orthostatic hypotension, which is treated with Florinef. He has chronic pain. He has chronic migraines, described as bi-frontal/occipital, constant dull pressure with episodic severe headaches.  Rarely they are associated with nausea.  No specific aggravating or relieving factors.   Past medications tried include nortriptyline (liver), topiramate, gabapentin, Cymbalta, Depakote, cyproheptadine, verapamil, acetazolamide, venlafaxine XR 150mg  , and Botox.   He has fatigue and is diagnosed with OSA. He was reportedly found to have possible papilledema on exam by an optometrist. He has headache and endorsed pulsatile tinnitus.  He underwent lumbar puncture on 09/22/16, which demonstrated opening pressure of 19.6 with no change in gait afterwards.  CSF cell count was 0, protein 76, glucose 74, negative VDRL, negative HSV and negative cytology.  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  MRV of the head from 01/06/17 showed a 2 cm segment of stenosis in the straight sinus.  He was started on Diamox 250mg  twice  daily.  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   He has had multiple MRIs and testing over  the years.  MRI of brain with and without contrast from 08/24/12, 07/12/13 and 07/04/15 reportedly showed T2 white matter hyperintensities within the bilateral cerebellar peduncles and mildly increased signal in the pons and posterior body of the corpus callosum, stable since 2011.  MRA of head from 07/12/13 was negative.    MRI of cervical spine without contrast from 02/23/14 showed right paracentral disc bulge at C5-C6 and left paracentral and lateral disc bulge at C6-C7 with no evidence of nerve root impingement.  Vitamin D was 31.5.  B12 in January 2017 was 109.  He was supplemented and repeat testing from that June was 330.  CK was 53.  TSH was 2.43.    To evaluate episode of blackout, he had normal awake and asleep EEG on 11/28/2017 and MRI of brain with and without contrast on 12/11/2017 which again demonstrated stable abnormal signal and volume loss in the middle cerebellar peduncles and pons but no acute intracranial abnormality.   On 03/05/18, he noticed that he lost his sense of taste.  It followed a severe headache.  It lasted almost a week.  He took 20 tramadol.    He reports sense of smell has faded as well.  He denies colds or other upper respiratory infection.  However, he had a flu shot 2 weeks prior to this headache.  MRI of brain with and without contrast on 05/09/18 demonstrated deviated septum but patent nasal cavity, some bilateral frontal sinusitis and stable findings consistent with cerebellar ataxia but no new or acute abnormalities to explain loss of sense of smell.  PAST MEDICAL HISTORY: Past Medical History:  Diagnosis Date   Anxiety state, unspecified    Attention deficit disorder without mention of hyperactivity    Cerebellar ataxia (HCC)    Essential hypertension, benign    IBS (irritable bowel syndrome)    Obesity, unspecified    Torsion of appendix testis     MEDICATIONS: Current Outpatient Medications on File Prior to Visit  Medication Sig Dispense Refill   diclofenac  Sodium (VOLTAREN) 1 % GEL SMARTSIG:4 Gram(s) Topical 3 Times Daily PRN     fludrocortisone (FLORINEF) 0.1 MG tablet Take 0.1 mg by mouth as directed.     Galcanezumab-gnlm (EMGALITY) 120 MG/ML SOAJ Inject 120 mg into the skin every 28 (twenty-eight) days. 1.12 mL 5   pregabalin (LYRICA) 150 MG capsule Take 200 mg by mouth 2 (two) times daily.      promethazine (PHENERGAN) 25 MG tablet Take 25 mg by mouth every 6 (six) hours as needed for nausea or vomiting.     traMADol (ULTRAM) 50 MG tablet Take 50 mg by mouth every 6 (six) hours as needed.     traZODone (DESYREL) 100 MG tablet Take 100 mg by mouth at bedtime.     venlafaxine XR (EFFEXOR XR) 75 MG 24 hr capsule Take 1 capsule (75 mg total) by mouth daily with breakfast. (Patient not taking: No sig reported) 30 capsule 0   zonisamide (ZONEGRAN) 100 MG capsule 100mg  capsule - take 1 capsule daily for one week, then 2 capsules daily for one week, then 3 capsules daily. 90 capsule 2   No current facility-administered medications on file prior to visit.    ALLERGIES: Allergies  Allergen Reactions   Nortriptyline Other (See Comments)    Renal  failure per pt- he told Dr Maple Hudson that he could not pee? Prostrate issues.  Problems with kidneys and liver    Alcohol Other (See Comments)    Blackouts/fainting   Alcohol-Sulfur [Elemental Sulfur]    Clonazepam    Topiramate Other (See Comments)    Erectile dysfunction    FAMILY HISTORY: Family History  Problem Relation Age of Onset   Cancer Other    Diabetes Other    Hypertension Other    Hypertension Mother    Anemia Mother    Dementia Father    Tremor Father       Objective:  Blood pressure (!) 147/90, pulse 89, height 6' (1.829 m), weight 257 lb (116.6 kg), SpO2 98 %. General: No acute distress.  Patient appears well-groomed.   Head:  Normocephalic/atraumatic Eyes:  Fundi examined but not visualized Neck: supple, no paraspinal tenderness, full range of motion Heart:  Regular rate  and rhythm Lungs:  Clear to auscultation bilaterally Back: No paraspinal tenderness Neurological Exam: alert and oriented to person, place, and time. Speech fluent and not dysarthric, language intact.  CN II-XII intact. Bulk and tone normal, muscle strength 5/5 throughout, slight head tremor and resting and kinetic tremor in hands.  Sensation to light touch, temperature and vibration intact.  Deep tendon reflexes 2+ throughout, toes downgoing.  Finger to nose and heel to shin testing intact.  Slightly ataxic.  Romberg with sway   Shon Millet, DO  CC: Benedetto Goad, MD

## 2020-12-31 ENCOUNTER — Other Ambulatory Visit: Payer: Self-pay

## 2020-12-31 ENCOUNTER — Encounter: Payer: Self-pay | Admitting: Neurology

## 2020-12-31 ENCOUNTER — Ambulatory Visit: Payer: Medicare Other | Admitting: Neurology

## 2020-12-31 ENCOUNTER — Other Ambulatory Visit: Payer: Self-pay | Admitting: Neurology

## 2020-12-31 VITALS — BP 147/90 | HR 89 | Ht 72.0 in | Wt 257.0 lb

## 2020-12-31 DIAGNOSIS — G118 Other hereditary ataxias: Secondary | ICD-10-CM | POA: Diagnosis not present

## 2020-12-31 DIAGNOSIS — G4733 Obstructive sleep apnea (adult) (pediatric): Secondary | ICD-10-CM

## 2020-12-31 DIAGNOSIS — G43709 Chronic migraine without aura, not intractable, without status migrainosus: Secondary | ICD-10-CM

## 2020-12-31 MED ORDER — PROPRANOLOL HCL ER 60 MG PO CP24: 60.0000 mg | ORAL_CAPSULE | Freq: Every day | ORAL | 5 refills | Status: DC

## 2020-12-31 NOTE — Patient Instructions (Addendum)
We will stop zonisamide.  Take 2 pills daily for 5 days, then 1 pill daily for 3 days, then STOP Start propranolol ER 60mg  daily.  If no improvement in headaches by end of bottle, contact me and I will increase dose.  Caution for lightheadedness. Will refer you to [facility] for sleep medicine

## 2021-02-14 ENCOUNTER — Other Ambulatory Visit: Payer: Self-pay | Admitting: Neurology

## 2021-07-07 NOTE — Progress Notes (Signed)
NEUROLOGY FOLLOW UP OFFICE NOTE  Victor [name] [id number]  Assessment/Plan:   Migraine without aura, without status migrainosus, not intractable - improved. 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  No recurrent spells with negative neurologic workup. OSA - uses CPAP but sleeping better.   1  Migraine prevention:  To further optimize migraine control, increase propranolol ER to 80mg  daily.   2.  Limit use of pain relievers to no more than 2 days out of week to prevent risk of rebound or medication-overuse headache. 3.  Keep headache diary 4.  Defers seeing sleep specialist at this time. 5.  Follow up 6 months.   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   UPDATE: In July, transitioned from zonisamide to propranolol. Headaches improved.  They are moderate, and occur 1 to 2 days a week.  He will lay down and it will resolve within 60 to 90 minutes.       Frequency of abortive medication: none Current NSAIDS:  None Current analgesics: Tramadol 50 mg (does not usually take - tries to avoid) Current triptans:  none Current ergotamine:  none Current anti-emetic: Promethazine 25 mg Current muscle relaxants:  none Current anti-anxiolytic:  none Current sleep aide: Trazodone 100 mg Current Antihypertensive medications:  propranolol ER 60mg  daily Current Antidepressant medications:  none Current Anticonvulsant medications: Lyrica 200 mg twice daily Current anti-CGRP:  none Current Vitamins/Herbal/Supplements:  none Current Antihistamines/Decongestants:  none Other therapy:  none   No black out spells.   Caffeine:  1 cup of coffee daily Depression:  No; Anxiety:  No Hydration:  8 to 10 1/2 liter bottles of water daily Other pain:  Chronic pain Sleep  hygiene:  His ill father passed away.  He is now sleeping better so he did not follow up with sleep medicine.   HISTORY: He has multiple medical issues.  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  His maternal grandfather had similar symptoms.  He has resting tremor, which was treated with Sinemet. He has orthostatic hypotension, which is treated with Florinef. He has chronic pain. He has chronic migraines, described as bi-frontal/occipital, constant dull pressure with episodic severe headaches.  Rarely they are associated with nausea.  No specific aggravating or relieving factors.   Past medications tried include nortriptyline (liver), topiramate, zonisamide, gabapentin, Cymbalta, Depakote, cyproheptadine, verapamil, acetazolamide, venlafaxine XR 150mg  , and Botox.   He has fatigue and is diagnosed with OSA. He was reportedly found to have possible papilledema on exam by an optometrist. He has headache and endorsed pulsatile tinnitus.  He underwent lumbar puncture on 09/22/16, which demonstrated opening pressure of 19.6 with no change in gait afterwards.  CSF cell count was 0, protein 76, glucose 74, negative VDRL, negative HSV and negative cytology.  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  MRV of the head from 01/06/17 showed a 2 cm segment of stenosis in the straight sinus.  He was started on Diamox 250mg  twice daily.  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   He has had multiple MRIs and testing over the years.  MRI of brain with and without contrast from 08/24/12, 07/12/13 and 07/04/15 reportedly showed T2 white matter hyperintensities within the  bilateral cerebellar peduncles and mildly increased signal in the pons and posterior body of the corpus callosum, stable since 2011.  MRA of head from  07/12/13 was negative.    MRI of cervical spine without contrast from 02/23/14 showed right paracentral disc bulge at C5-C6 and left paracentral and lateral disc bulge at C6-C7 with no evidence of nerve root impingement.  Vitamin D was 31.5.  B12 in January 2017 was 109.  He was supplemented and repeat testing from that June was 330.  CK was 53.  TSH was 2.43.    To evaluate episode of blackout, he had normal awake and asleep EEG on 11/28/2017 and MRI of brain with and without contrast on 12/11/2017 which again demonstrated stable abnormal signal and volume loss in the middle cerebellar peduncles and pons but no acute intracranial abnormality.   On 03/05/18, he noticed that he lost his sense of taste.  It followed a severe headache.  It lasted almost a week.  He took 20 tramadol.    He reports sense of smell has faded as well.  He denies colds or other upper respiratory infection.  However, he had a flu shot 2 weeks prior to this headache.  MRI of brain with and without contrast on 05/09/18 demonstrated deviated septum but patent nasal cavity, some bilateral frontal sinusitis and stable findings consistent with cerebellar ataxia but no new or acute abnormalities to explain loss of sense of smell.  PAST MEDICAL HISTORY: Past Medical History:  Diagnosis Date   Anxiety state, unspecified    Attention deficit disorder without mention of hyperactivity    Cerebellar ataxia (HCC)    Essential hypertension, benign    IBS (irritable bowel syndrome)    Obesity, unspecified    Torsion of appendix testis     MEDICATIONS: Current Outpatient Medications on File Prior to Visit  Medication Sig Dispense Refill   diclofenac Sodium (VOLTAREN) 1 % GEL SMARTSIG:4 Gram(s) Topical 3 Times Daily PRN (Patient not taking: Reported on 12/31/2020)     fludrocortisone (FLORINEF) 0.1 MG tablet Take 0.1 mg by mouth as directed. (Patient not taking: Reported on 12/31/2020)     Galcanezumab-gnlm (EMGALITY) 120 MG/ML SOAJ Inject 120 mg  into the skin every 28 (twenty-eight) days. (Patient not taking: Reported on 12/31/2020) 1.12 mL 5   pregabalin (LYRICA) 150 MG capsule Take 200 mg by mouth 2 (two) times daily.  (Patient not taking: Reported on 12/31/2020)     promethazine (PHENERGAN) 25 MG tablet Take 25 mg by mouth every 6 (six) hours as needed for nausea or vomiting. (Patient not taking: Reported on 12/31/2020)     propranolol ER (INDERAL LA) 60 MG 24 hr capsule Take 1 capsule (60 mg total) by mouth daily. 30 capsule 5   traMADol (ULTRAM) 50 MG tablet Take 50 mg by mouth every 6 (six) hours as needed. (Patient not taking: Reported on 12/31/2020)     traZODone (DESYREL) 100 MG tablet Take 100 mg by mouth at bedtime. (Patient not taking: Reported on 12/31/2020)     venlafaxine XR (EFFEXOR XR) 75 MG 24 hr capsule Take 1 capsule (75 mg total) by mouth daily with breakfast. (Patient not taking: Reported on 12/31/2020) 30 capsule 0   zonisamide (ZONEGRAN) 100 MG capsule 100mg  capsule - take 1 capsule daily for one week, then 2 capsules daily for one week, then 3 capsules daily. 90 capsule 2   No current facility-administered medications on file prior to visit.    ALLERGIES: Allergies  Allergen  Reactions   Nortriptyline Other (See Comments)    Renal failure per pt- he told Dr Maple HudsonMoser that he could not pee? Prostrate issues.  Problems with kidneys and liver    Alcohol Other (See Comments)    Blackouts/fainting   Alcohol-Sulfur [Elemental Sulfur]    Clonazepam    Topiramate Other (See Comments)    Erectile dysfunction    FAMILY HISTORY: Family History  Problem Relation Age of Onset   Cancer Other    Diabetes Other    Hypertension Other    Hypertension Mother    Anemia Mother    Dementia Father    Tremor Father       Objective:  Blood pressure 140/74, pulse 77, height 6\' 1"  (1.854 m), weight 275 lb 12.8 oz (125.1 kg), SpO2 98 %. General: No acute distress.  Patient appears well-groomed.     Shon MilletAdam Olajuwon Fosdick, DO  CC: Benedetto GoadFred  Wilson, MD

## 2021-07-08 ENCOUNTER — Ambulatory Visit: Payer: Medicare Other | Admitting: Neurology

## 2021-07-08 ENCOUNTER — Other Ambulatory Visit: Payer: Self-pay

## 2021-07-08 ENCOUNTER — Encounter: Payer: Self-pay | Admitting: Neurology

## 2021-07-08 VITALS — BP 140/74 | HR 77 | Ht 73.0 in | Wt 275.8 lb

## 2021-07-08 DIAGNOSIS — G118 Other hereditary ataxias: Secondary | ICD-10-CM

## 2021-07-08 DIAGNOSIS — G43009 Migraine without aura, not intractable, without status migrainosus: Secondary | ICD-10-CM | POA: Diagnosis not present

## 2021-07-08 MED ORDER — PROPRANOLOL HCL ER 80 MG PO CP24: 80.0000 mg | ORAL_CAPSULE | Freq: Every day | ORAL | 5 refills | Status: DC

## 2021-07-08 NOTE — Patient Instructions (Signed)
Increase propranolol ER to 80mg  daily.   Limit use of pain relievers to no more than 2 days out of week to prevent risk of rebound or medication-overuse headache. Keep headache diary Follow up 6 months.

## 2022-01-11 NOTE — Progress Notes (Unsigned)
NEUROLOGY FOLLOW UP OFFICE NOTE  ELISON WORREL [id number]  Assessment/Plan:   Migraine without aura, without status migrainosus, not intractable - improved. 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  No recurrent spells with negative neurologic workup. OSA - uses CPAP but sleeping better.   1  Migraine prevention:  To further optimize migraine control, increase propranolol ER to 80mg  daily.   2.  Limit use of pain relievers to no more than 2 days out of week to prevent risk of rebound or medication-overuse headache. 3.  Keep headache diary 4.  Defers seeing sleep specialist at this time. 5.  Follow up 6 months.   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   UPDATE: Increased propranolol to 80mg .  *** They are moderate, and occur 1 to 2 days a week.  He will lay down and it will resolve within 60 to 90 minutes.        Frequency of abortive medication: none Current NSAIDS:  None Current analgesics: Tramadol 50 mg (does not usually take - tries to avoid) Current triptans:  none Current ergotamine:  none Current anti-emetic: Promethazine 25 mg Current muscle relaxants:  none Current anti-anxiolytic:  none Current sleep aide: Trazodone 100 mg Current Antihypertensive medications:  propranolol ER 80mg  daily Current Antidepressant medications:  none Current Anticonvulsant medications: Lyrica 200 mg twice daily Current anti-CGRP:  none Current Vitamins/Herbal/Supplements:  none Current Antihistamines/Decongestants:  none Other therapy:  none   No black out spells.   Caffeine:  1 cup of coffee daily Depression:  No; Anxiety:  No Hydration:  8 to 10 1/2 liter bottles of water daily Other pain:  Chronic pain Sleep hygiene:  His ill father passed away.  He  is now sleeping better so he did not follow up with sleep medicine.   HISTORY: He has multiple medical issues.  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  His maternal grandfather had similar symptoms.  He has resting tremor, which was treated with Sinemet. He has orthostatic hypotension, which is treated with Florinef. He has chronic pain. He has chronic migraines, described as bi-frontal/occipital, constant dull pressure with episodic severe headaches.  Rarely they are associated with nausea.  No specific aggravating or relieving factors.   Past medications tried include nortriptyline (liver), topiramate, zonisamide, gabapentin, Cymbalta, Depakote, cyproheptadine, verapamil, acetazolamide, venlafaxine XR 150mg  , and Botox.   He has fatigue and is diagnosed with OSA. He was reportedly found to have possible papilledema on exam by an optometrist. He has headache and endorsed pulsatile tinnitus.  He underwent lumbar puncture on 09/22/16, which demonstrated opening pressure of 19.6 with no change in gait afterwards.  CSF cell count was 0, protein 76, glucose 74, negative VDRL, negative HSV and negative cytology.  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  MRV of the head from 01/06/17 showed a 2 cm segment of stenosis in the straight sinus.  He was started on Diamox 250mg  twice daily.  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   He has had multiple MRIs and testing over the years.  MRI of brain with and without contrast from 08/24/12, 07/12/13 and 07/04/15 reportedly showed T2 white matter hyperintensities within the bilateral cerebellar peduncles  and mildly increased signal in the pons and posterior body of the corpus callosum, stable since 2011.  MRA of head from 07/12/13 was negative.    MRI of cervical spine  without contrast from 02/23/14 showed right paracentral disc bulge at C5-C6 and left paracentral and lateral disc bulge at C6-C7 with no evidence of nerve root impingement.  Vitamin D was 31.5.  B12 in January 2017 was 109.  He was supplemented and repeat testing from that June was 330.  CK was 53.  TSH was 2.43.    To evaluate episode of blackout, he had normal awake and asleep EEG on 11/28/2017 and MRI of brain with and without contrast on 12/11/2017 which again demonstrated stable abnormal signal and volume loss in the middle cerebellar peduncles and pons but no acute intracranial abnormality.   On 03/05/18, he noticed that he lost his sense of taste.  It followed a severe headache.  It lasted almost a week.  He took 20 tramadol.    He reports sense of smell has faded as well.  He denies colds or other upper respiratory infection.  However, he had a flu shot 2 weeks prior to this headache.  MRI of brain with and without contrast on 05/09/18 demonstrated deviated septum but patent nasal cavity, some bilateral frontal sinusitis and stable findings consistent with cerebellar ataxia but no new or acute abnormalities to explain loss of sense of smell.  Past medications:  zonisamide  PAST MEDICAL HISTORY: Past Medical History:  Diagnosis Date   Anxiety state, unspecified    Attention deficit disorder without mention of hyperactivity    Cerebellar ataxia (HCC)    Essential hypertension, benign    IBS (irritable bowel syndrome)    Obesity, unspecified    Torsion of appendix testis     MEDICATIONS: Current Outpatient Medications on File Prior to Visit  Medication Sig Dispense Refill   diclofenac Sodium (VOLTAREN) 1 % GEL      fludrocortisone (FLORINEF) 0.1 MG tablet Take 0.1 mg by mouth as directed.     Galcanezumab-gnlm (EMGALITY) 120 MG/ML SOAJ Inject 120 mg into the skin every 28 (twenty-eight) days. 1.12 mL 5   pregabalin (LYRICA) 150 MG capsule Take 200 mg by mouth 2 (two) times daily.      promethazine (PHENERGAN) 25 MG tablet Take 25 mg by mouth every 6 (six) hours as needed for nausea or vomiting.     propranolol ER (INDERAL LA) 80 MG 24 hr capsule Take 1 capsule (80 mg total) by mouth daily. 30 capsule 5   traMADol (ULTRAM) 50 MG tablet Take 50 mg by mouth every 6 (six) hours as needed.     traZODone (DESYREL) 100 MG tablet Take 100 mg by mouth at bedtime.     venlafaxine XR (EFFEXOR XR) 75 MG 24 hr capsule Take 1 capsule (75 mg total) by mouth daily with breakfast. 30 capsule 0   zonisamide (ZONEGRAN) 100 MG capsule 100mg  capsule - take 1 capsule daily for one week, then 2 capsules daily for one week, then 3 capsules daily. 90 capsule 2   No current facility-administered medications on file prior to visit.    ALLERGIES: Allergies  Allergen Reactions   Nortriptyline Other (See Comments)    Renal failure per pt- he told Dr that he could not pee? Prostrate issues.  Problems with kidneys and liver    Alcohol Other (See Comments)    Blackouts/fainting   Alcohol-Sulfur [Elemental Sulfur]    Clonazepam  Topiramate Other (See Comments)    Erectile dysfunction    FAMILY HISTORY: Family History  Problem Relation Age of Onset   Cancer Other    Diabetes Other    Hypertension Other    Hypertension Mother    Anemia Mother    Dementia Father    Tremor Father       Objective:  *** General: No acute distress.  Patient appears well-groomed.   Head:  Normocephalic/atraumatic Eyes:  Fundi examined but not visualized Neck: supple, no paraspinal tenderness, full range of motion Heart:  Regular rate and rhythm Neurological Exam: alert and oriented to person, place, and time.  Speech fluent and not dysarthric, language intact.  CN II-XII intact. Bulk and tone normal, muscle strength 5/5 throughout.  Slight head tremor and resting and kinetic tremor in both hands.  Sensation to light touch intact.  Deep tendon reflexes 2+ throughout, toes downgoing.  Finger to nose  testing intact.  Gait slightly ataxia.  Romberg with sway.   Shon Millet, DO  CC: Benedetto Goad, MD

## 2022-01-12 ENCOUNTER — Ambulatory Visit: Payer: Medicare Other | Admitting: Neurology

## 2022-01-12 ENCOUNTER — Encounter: Payer: Self-pay | Admitting: Neurology

## 2022-01-12 VITALS — BP 152/76 | HR 84 | Resp 18 | Ht 75.0 in | Wt 268.0 lb

## 2022-01-12 DIAGNOSIS — G43009 Migraine without aura, not intractable, without status migrainosus: Secondary | ICD-10-CM | POA: Diagnosis not present

## 2022-01-12 DIAGNOSIS — G4733 Obstructive sleep apnea (adult) (pediatric): Secondary | ICD-10-CM | POA: Diagnosis not present

## 2022-01-12 DIAGNOSIS — G118 Other hereditary ataxias: Secondary | ICD-10-CM

## 2022-01-12 NOTE — Patient Instructions (Signed)
Continue propranolol ER 80mg  daily

## 2022-01-24 ENCOUNTER — Other Ambulatory Visit: Payer: Self-pay | Admitting: Neurology

## 2022-07-16 ENCOUNTER — Emergency Department (HOSPITAL_COMMUNITY)
Admission: EM | Admit: 2022-07-16 | Discharge: 2022-07-16 | Disposition: A | Discharge status: Home / Self Care | Payer: Medicare Other | Attending: Student | Admitting: Student

## 2022-07-16 ENCOUNTER — Emergency Department (HOSPITAL_COMMUNITY): Payer: Medicare Other

## 2022-07-16 ENCOUNTER — Other Ambulatory Visit: Payer: Self-pay

## 2022-07-16 DIAGNOSIS — Z87891 Personal history of nicotine dependence: Secondary | ICD-10-CM | POA: Insufficient documentation

## 2022-07-16 DIAGNOSIS — I10 Essential (primary) hypertension: Secondary | ICD-10-CM | POA: Diagnosis not present

## 2022-07-16 DIAGNOSIS — U071 COVID-19: Secondary | ICD-10-CM | POA: Diagnosis not present

## 2022-07-16 DIAGNOSIS — R531 Weakness: Secondary | ICD-10-CM | POA: Diagnosis present

## 2022-07-16 DIAGNOSIS — R5383 Other fatigue: Secondary | ICD-10-CM

## 2022-07-16 LAB — CBC WITH DIFFERENTIAL/PLATELET
Abs Immature Granulocytes: 0 10*3/uL (ref 0.00–0.07)
Basophils Absolute: 0 10*3/uL (ref 0.0–0.1)
Basophils Relative: 0 %
Eosinophils Absolute: 0 10*3/uL (ref 0.0–0.5)
Eosinophils Relative: 1 %
HCT: 51.7 % (ref 39.0–52.0)
Hemoglobin: 17.5 g/dL — ABNORMAL HIGH (ref 13.0–17.0)
Immature Granulocytes: 0 %
Lymphocytes Relative: 23 %
Lymphs Abs: 0.7 10*3/uL (ref 0.7–4.0)
MCH: 27.6 pg (ref 26.0–34.0)
MCHC: 33.8 g/dL (ref 30.0–36.0)
MCV: 81.7 fL (ref 80.0–100.0)
Monocytes Absolute: 0.3 10*3/uL (ref 0.1–1.0)
Monocytes Relative: 10 %
Neutro Abs: 1.9 10*3/uL (ref 1.7–7.7)
Neutrophils Relative %: 66 %
Platelets: 112 10*3/uL — ABNORMAL LOW (ref 150–400)
RBC: 6.33 MIL/uL — ABNORMAL HIGH (ref 4.22–5.81)
RDW: 14 % (ref 11.5–15.5)
WBC: 2.8 10*3/uL — ABNORMAL LOW (ref 4.0–10.5)
nRBC: 0 % (ref 0.0–0.2)

## 2022-07-16 LAB — URINALYSIS, ROUTINE W REFLEX MICROSCOPIC
Bilirubin Urine: NEGATIVE
Glucose, UA: NEGATIVE mg/dL
Hgb urine dipstick: NEGATIVE
Ketones, ur: 5 mg/dL — AB
Leukocytes,Ua: NEGATIVE
Nitrite: NEGATIVE
Protein, ur: NEGATIVE mg/dL
Specific Gravity, Urine: 1.026 (ref 1.005–1.030)
pH: 5 (ref 5.0–8.0)

## 2022-07-16 LAB — COMPREHENSIVE METABOLIC PANEL
ALT: 46 U/L — ABNORMAL HIGH (ref 0–44)
AST: 91 U/L — ABNORMAL HIGH (ref 15–41)
Albumin: 4 g/dL (ref 3.5–5.0)
Alkaline Phosphatase: 57 U/L (ref 38–126)
Anion gap: 13 (ref 5–15)
BUN: 13 mg/dL (ref 6–20)
CO2: 24 mmol/L (ref 22–32)
Calcium: 9.1 mg/dL (ref 8.9–10.3)
Chloride: 96 mmol/L — ABNORMAL LOW (ref 98–111)
Creatinine, Ser: 1.4 mg/dL — ABNORMAL HIGH (ref 0.61–1.24)
GFR, Estimated: 58 mL/min — ABNORMAL LOW (ref >60)
Glucose, Bld: 124 mg/dL — ABNORMAL HIGH (ref 70–99)
Potassium: 3.6 mmol/L (ref 3.5–5.1)
Sodium: 133 mmol/L — ABNORMAL LOW (ref 135–145)
Total Bilirubin: 0.9 mg/dL (ref 0.3–1.2)
Total Protein: 7.2 g/dL (ref 6.5–8.1)

## 2022-07-16 LAB — RESP PANEL BY RT-PCR (RSV, FLU A&B, COVID)  RVPGX2
Influenza A by PCR: NEGATIVE
Influenza B by PCR: NEGATIVE
Resp Syncytial Virus by PCR: NEGATIVE
SARS Coronavirus 2 by RT PCR: POSITIVE — AB

## 2022-07-16 LAB — TSH: TSH: 3.136 u[IU]/mL (ref 0.350–4.500)

## 2022-07-16 MED ORDER — PAXLOVID (300/100) 20 X 150 MG & 10 X 100MG PO TBPK: 3.0000 | ORAL_TABLET | Freq: Two times a day (BID) | ORAL | 0 refills | Status: AC

## 2022-07-16 MED ORDER — ACETAMINOPHEN 500 MG PO TABS
1000.0000 mg | ORAL_TABLET | Freq: Once | ORAL | Status: AC
  Administered 2022-07-16: 1000 mg via ORAL
  Filled 2022-07-16: qty 2

## 2022-07-16 MED ORDER — LACTATED RINGERS IV BOLUS
1000.0000 mL | Freq: Once | INTRAVENOUS | Status: AC
  Administered 2022-07-16: 1000 mL via INTRAVENOUS

## 2022-07-16 NOTE — ED Provider Notes (Signed)
[place] EMERGENCY DEPARTMENT AT [facility] Provider Note  CSN: [id number] Arrival date & time: 07/16/22 1814  Chief Complaint(s) Weakness  HPI Victor [name] is a 62 y.o. male with PMH cerebellar ataxia who presents emergency department for evaluation of generalized weakness and fatigue.  Patient states that he over the last 48 hours has become significantly more fatigued than normal.  He states he has had multiple falls at home due to this excessive fatigue and he feels dehydrated.  He endorses persistent cough but denies shortness of breath, abdominal pain, nausea, vomiting or other systemic symptoms.  Patient denies striking his head during these falls.   Past Medical History Past Medical History:  Diagnosis Date   Anxiety state, unspecified    Attention deficit disorder without mention of hyperactivity    Cerebellar ataxia (HCC)    Essential hypertension, benign    IBS (irritable bowel syndrome)    Obesity, unspecified    Torsion of appendix testis    Patient Active Problem List   Diagnosis Date Noted   Cerebellar ataxia (HCC)    Home Medication(s) Prior to Admission medications   Medication Sig Start Date End Date Taking? Authorizing Provider  nirmatrelvir & ritonavir (PAXLOVID, 300/100,) 20 x 150 MG & 10 x 100MG  TBPK Take 3 tablets by mouth 2 (two) times daily for 5 days. 07/16/22 07/21/22 Yes Maryln Eastham, MD  diclofenac Sodium (VOLTAREN) 1 % GEL  12/06/19   [provider]  fludrocortisone (FLORINEF) 0.1 MG tablet Take 0.1 mg by mouth as directed. Patient not taking: Reported on 01/12/2022 12/02/14   [provider]  Galcanezumab-gnlm (EMGALITY) 120 MG/ML SOAJ Inject 120 mg into the skin every 28 (twenty-eight) days. Patient not taking: Reported on 01/12/2022 06/25/20   08/23/20, DO  pregabalin (LYRICA) 150 MG capsule Take 200 mg by mouth 2 (two) times daily. Patient not taking: Reported on 01/12/2022    [provider]  promethazine  (PHENERGAN) 25 MG tablet Take 25 mg by mouth every 6 (six) hours as needed for nausea or vomiting.    [provider]  propranolol ER (INDERAL LA) 80 MG 24 hr capsule TAKE 1 CAPSULE BY MOUTH EVERY DAY 01/25/22   01/27/22, Adam R, DO  traMADol (ULTRAM) 50 MG tablet Take 50 mg by mouth every 6 (six) hours as needed. Patient not taking: Reported on 01/12/2022    [provider]  traZODone (DESYREL) 100 MG tablet Take 100 mg by mouth at bedtime.    [provider]  venlafaxine XR (EFFEXOR XR) 75 MG 24 hr capsule Take 1 capsule (75 mg total) by mouth daily with breakfast. Patient not taking: Reported on 01/12/2022 08/18/17   10/18/17, DO  zonisamide (ZONEGRAN) 100 MG capsule 100mg  capsule - take 1 capsule daily for one week, then 2 capsules daily for one week, then 3 capsules daily. Patient not taking: Reported on 01/12/2022 09/29/20   03/14/2022, DO  Past Surgical History Past Surgical History:  Procedure Laterality Date   COLOSTOMY     x2, abdomen ostomy colostomy   Family History Family History  Problem Relation Age of Onset   Cancer Other    Diabetes Other    Hypertension Other    Hypertension Mother    Anemia Mother    Dementia Father    Tremor Father     Social History Social History   Tobacco Use   Smoking status: Former   Smokeless tobacco: Never  Scientific laboratory technician Use: Never used  Substance Use Topics   Alcohol use: No   Drug use: No   Allergies Nortriptyline, Alcohol, Alcohol-sulfur [elemental sulfur], Clonazepam, and Topiramate  Review of Systems Review of Systems  Constitutional:  Positive for fatigue.  Respiratory:  Positive for cough.     Physical Exam Vital Signs  I have reviewed the triage vital signs BP 110/70   Pulse 78   Temp 99 F (37.2 C) (Oral)   Resp 18   Ht 6\' 3"  (1.905 m)   Wt 124.7 kg    SpO2 94%   BMI 34.37 kg/m   Physical Exam Constitutional:      General: He is not in acute distress.    Appearance: Normal appearance.  HENT:     Head: Normocephalic and atraumatic.     Nose: No congestion or rhinorrhea.  Eyes:     General:        Right eye: No discharge.        Left eye: No discharge.     Extraocular Movements: Extraocular movements intact.     Pupils: Pupils are equal, round, and reactive to light.  Cardiovascular:     Rate and Rhythm: Normal rate and regular rhythm.     Heart sounds: No murmur heard. Pulmonary:     Effort: No respiratory distress.     Breath sounds: No wheezing or rales.  Abdominal:     General: There is no distension.     Tenderness: There is no abdominal tenderness.  Musculoskeletal:        General: Normal range of motion.     Cervical back: Normal range of motion.  Skin:    General: Skin is warm and dry.  Neurological:     General: No focal deficit present.     Mental Status: He is alert.     ED Results and Treatments Labs (all labs ordered are listed, but only abnormal results are displayed) Labs Reviewed  RESP PANEL BY RT-PCR (RSV, FLU A&B, COVID)  RVPGX2 - Abnormal; Notable for the following components:      Result Value   SARS Coronavirus 2 by RT PCR POSITIVE (*)    All other components within normal limits  COMPREHENSIVE METABOLIC PANEL - Abnormal; Notable for the following components:   Sodium 133 (*)    Chloride 96 (*)    Glucose, Bld 124 (*)    Creatinine, Ser 1.40 (*)    AST 91 (*)    ALT 46 (*)    GFR, Estimated 58 (*)    All other components within normal limits  CBC WITH DIFFERENTIAL/PLATELET - Abnormal; Notable for the following components:   WBC 2.8 (*)    RBC 6.33 (*)    Hemoglobin 17.5 (*)    Platelets 112 (*)    All other components within normal limits  URINALYSIS, ROUTINE W REFLEX MICROSCOPIC - Abnormal; Notable for the following components:   Color, Urine AMBER (*)  APPearance HAZY (*)     Ketones, ur 5 (*)    All other components within normal limits  TSH                                                                                                                          Radiology CT Head Wo Contrast  Result Date: 07/16/2022 CLINICAL DATA:  Head trauma, moderate to severe. Worsening weakness. Fell 4 times this week. EXAM: CT HEAD WITHOUT CONTRAST TECHNIQUE: Contiguous axial images were obtained from the base of the skull through the vertex without intravenous contrast. RADIATION DOSE REDUCTION: This exam was performed according to the departmental dose-optimization program which includes automated exposure control, adjustment of the mA and/or kV according to patient size and/or use of iterative reconstruction technique. COMPARISON:  MRI brain 05/08/2018.  CT head 08/19/2008 FINDINGS: Brain: Diffuse cerebral atrophy. Ventricular dilatation consistent with central atrophy. Low-attenuation changes in the deep white matter consistent with small vessel ischemia. No abnormal extra-axial fluid collections. No mass effect or midline shift. Gray-white matter junctions are distinct. Basal cisterns are not effaced. No acute intracranial hemorrhage. Vascular: No hyperdense vessel or unexpected calcification. Skull: Calvarium appears intact. Sinuses/Orbits: Partial opacification of the ethmoid air cells with mucosal thickening in the maxillary antra. No acute air-fluid levels. Mastoid air cells are clear. Other: None. IMPRESSION: No acute intracranial abnormalities. Chronic atrophy and small vessel ischemic changes, prominent for age. Electronically Signed   By: Lucienne Capers M.D.   On: 07/16/2022 21:25   DG Chest Portable 1 View  Result Date: 07/16/2022 CLINICAL DATA:  Weakness, multiple falls EXAM: PORTABLE CHEST 1 VIEW COMPARISON:  None Available. FINDINGS: Single frontal view of the chest demonstrates an unremarkable cardiac silhouette. Low lung volumes. No airspace disease, effusion, or  pneumothorax. No acute bony abnormality. IMPRESSION: 1. Low lung volumes, no acute process. Electronically Signed   By: Randa Ngo M.D.   On: 07/16/2022 19:02    Pertinent labs & imaging results that were available during my care of the patient were reviewed by me and considered in my medical decision making (see MDM for details).  Medications Ordered in ED Medications  lactated ringers bolus 1,000 mL (0 mLs Intravenous Stopped 07/16/22 2132)  acetaminophen (TYLENOL) tablet 1,000 mg (1,000 mg Oral Given 07/16/22 2027)  Procedures Procedures  (including critical care time)  Medical Decision Making / ED Course   This patient presents to the ED for concern of generalized fatigue, frequent falls, this involves an extensive number of treatment options, and is a complaint that carries with it a high risk of complications and morbidity.  The differential diagnosis includes COVID-19, influenza, RSV, pneumonia, electrolyte abnormality, dehydration, traumatic injury from fall  MDM: Patient seen emergency room for evaluation of generalized fatigue and frequent falls.  Physical exam largely unremarkable.  Laboratory evaluation with a leukopenia to 2.8, hemoglobin 17.5, platelet count 112, creatinine 1.40 which is minimally elevated from patient's baseline of 1.2, AST 91, ALT 46, urinalysis unremarkable, TSH unremarkable.  Patient is COVID-positive which likely explains his significant fatigue.  Chest x-ray and CT head without acute injuries.  Patient fluid resuscitated and fever addressed with Tylenol and on reevaluation patient feeling significant improved.  He was able to ambulate with use of a walker here and states that he feels strong enough to go home.  At this time he does not meet inpatient criteria for admission and he is safe for discharge with outpatient follow-up.   Paxlovid sent to his pharmacy.   Additional history obtained:  -External records from outside source obtained and reviewed including: Chart review including previous notes, labs, imaging, consultation notes   Lab Tests: -I ordered, reviewed, and interpreted labs.   The pertinent results include:   Labs Reviewed  RESP PANEL BY RT-PCR (RSV, FLU A&B, COVID)  RVPGX2 - Abnormal; Notable for the following components:      Result Value   SARS Coronavirus 2 by RT PCR POSITIVE (*)    All other components within normal limits  COMPREHENSIVE METABOLIC PANEL - Abnormal; Notable for the following components:   Sodium 133 (*)    Chloride 96 (*)    Glucose, Bld 124 (*)    Creatinine, Ser 1.40 (*)    AST 91 (*)    ALT 46 (*)    GFR, Estimated 58 (*)    All other components within normal limits  CBC WITH DIFFERENTIAL/PLATELET - Abnormal; Notable for the following components:   WBC 2.8 (*)    RBC 6.33 (*)    Hemoglobin 17.5 (*)    Platelets 112 (*)    All other components within normal limits  URINALYSIS, ROUTINE W REFLEX MICROSCOPIC - Abnormal; Notable for the following components:   Color, Urine AMBER (*)    APPearance HAZY (*)    Ketones, ur 5 (*)    All other components within normal limits  TSH     Imaging Studies ordered: I ordered imaging studies including chest x-ray, CT head I independently visualized and interpreted imaging. I agree with the radiologist interpretation   Medicines ordered and prescription drug management: Meds ordered this encounter  Medications   lactated ringers bolus 1,000 mL   acetaminophen (TYLENOL) tablet 1,000 mg   nirmatrelvir & ritonavir (PAXLOVID, 300/100,) 20 x 150 MG & 10 x 100MG  TBPK    Sig: Take 3 tablets by mouth 2 (two) times daily for 5 days.    Dispense:  30 tablet    Refill:  0    -I have reviewed the patients home medicines and have made adjustments as needed  Critical interventions none    Cardiac Monitoring: The patient was  maintained on a cardiac monitor.  I personally viewed and interpreted the cardiac monitored which showed an underlying rhythm of: NSR  Social Determinants of Health:  Factors  impacting patients care include: none   Reevaluation: After the interventions noted above, I reevaluated the patient and found that they have :improved  Co morbidities that complicate the patient evaluation  Past Medical History:  Diagnosis Date   Anxiety state, unspecified    Attention deficit disorder without mention of hyperactivity    Cerebellar ataxia (HCC)    Essential hypertension, benign    IBS (irritable bowel syndrome)    Obesity, unspecified    Torsion of appendix testis       Dispostion: I considered admission for this patient, but at this time he does not meet inpatient criteria for admission.  He is able to ambulate here in the emergency department and thus safe for discharge outpatient follow-up.  He was given strict return precautions of which he voiced understanding.     Final Clinical Impression(s) / ED Diagnoses Final diagnoses:  [id number]  Other fatigue     @PCDICTATION @    Teressa Lower, MD 07/16/22 2322

## 2022-07-16 NOTE — ED Notes (Signed)
Pt ambulated with assistance of walker as he states that he uses a cane at home. Pt was able to stand from sitting position and states that he feels comfortable being discharged

## 2022-07-16 NOTE — ED Triage Notes (Signed)
Patient called for worsening weakness, states has fell 4 times this week.  No specific injury, states has been leaning to the left, and a possible Parkinsons diagnosis may be playing a role, worsening tremors.

## 2022-07-16 NOTE — ED Notes (Signed)
Pt has called for discharge transport and awaiting arrival in room due to isolation precautions

## 2022-07-16 NOTE — ED Notes (Signed)
Patient states to update his niece, Fransisca Connors [phone].

## 2022-08-22 ENCOUNTER — Other Ambulatory Visit: Payer: Self-pay | Admitting: Neurology

## 2022-10-11 NOTE — Progress Notes (Unsigned)
NEUROLOGY FOLLOW UP OFFICE NOTE  Victor [name] [id number]  Assessment/Plan:   Migraine without aura, without status migrainosus, not intractable - improved. 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  No recurrent spells with negative neurologic workup. OSA - uses CPAP but sleeping better.   1  Migraine prevention:  Propranolol ER to 80mg  daily. ***  2.  Limit use of pain relievers to no more than 2 days out of week to prevent risk of rebound or medication-overuse headache. 3.  Keep headache diary 4.  Defers seeing sleep specialist at this time. 5.  Follow up 9 months.   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   UPDATE: *** Frequency of abortive medication: none Current NSAIDS:  None Current analgesics: none Current triptans:  none Current ergotamine:  none Current anti-emetic: Promethazine 25 mg Current muscle relaxants:  none Current anti-anxiolytic:  none Current sleep aide: Trazodone 100 mg Current Antihypertensive medications:  propranolol ER 80mg  daily Current Antidepressant medications:  trazodone PRN for insomnia Current Anticonvulsant medications: Lyrica 200 mg twice daily Current anti-CGRP:  none Current Vitamins/Herbal/Supplements:  none Current Antihistamines/Decongestants:  Zyrtec, Flonase Other therapy:  none   No black out spells.   Caffeine:  1 cup of coffee daily Depression:  No; Anxiety:  No Hydration:  8 to 10 1/2 liter bottles of water daily Other pain:  Chronic pain Sleep hygiene:  His ill father passed away.  He is now sleeping better so he did not follow up with sleep medicine.   HISTORY: He has multiple medical issues.  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  His maternal grandfather had similar symptoms.  He has resting tremor, which was treated with Sinemet. He has orthostatic hypotension, which is treated with Florinef. He has chronic pain. He has chronic migraines, described as bi-frontal/occipital, constant dull pressure with episodic severe headaches.  Rarely they are associated with nausea.  No specific aggravating or relieving factors.   Past medications tried include nortriptyline (liver), topiramate, zonisamide, gabapentin, Cymbalta, Depakote, cyproheptadine, verapamil, acetazolamide, venlafaxine XR 150mg  , and Botox.   He has fatigue and is diagnosed with OSA. He was reportedly found to have possible papilledema on exam by an optometrist. He has headache and endorsed pulsatile tinnitus.  He underwent lumbar puncture on 09/22/16, which demonstrated opening pressure of 19.6 with no change in gait afterwards.  CSF cell count was 0, protein 76, glucose 74, negative VDRL, negative HSV and negative cytology.  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  MRV of the head from 01/06/17 showed a 2 cm segment of stenosis in the straight sinus.  He was started on Diamox 250mg  twice daily.  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   He has had multiple MRIs and testing over the years.  MRI of brain with and without contrast from 08/24/12, 07/12/13 and 07/04/15 reportedly showed T2 white matter hyperintensities within the bilateral cerebellar peduncles and mildly increased signal in the pons and posterior body of the corpus callosum, stable since 2011.  MRA of head from 07/12/13 was negative.    MRI of cervical spine without contrast from 02/23/14 showed right paracentral disc bulge at C5-C6 and left paracentral and lateral disc  bulge at C6-C7 with no evidence of nerve root impingement.  Vitamin D was 31.5.  B12 in January 2017  was 109.  He was supplemented and repeat testing from that June was 330.  CK was 53.  TSH was 2.43.    To evaluate episode of blackout, he had normal awake and asleep EEG on 11/28/2017 and MRI of brain with and without contrast on 12/11/2017 which again demonstrated stable abnormal signal and volume loss in the middle cerebellar peduncles and pons but no acute intracranial abnormality.   On 03/05/18, he noticed that he lost his sense of taste.  It followed a severe headache.  It lasted almost a week.  He took 20 tramadol.    He reports sense of smell has faded as well.  He denies colds or other upper respiratory infection.  However, he had a flu shot 2 weeks prior to this headache.  MRI of brain with and without contrast on 05/09/18 demonstrated deviated septum but patent nasal cavity, some bilateral frontal sinusitis and stable findings consistent with cerebellar ataxia but no new or acute abnormalities to explain loss of sense of smell.   PAST MEDICAL HISTORY: Past Medical History:  Diagnosis Date   Anxiety state, unspecified    Attention deficit disorder without mention of hyperactivity    Cerebellar ataxia (HCC)    Essential hypertension, benign    IBS (irritable bowel syndrome)    Obesity, unspecified    Torsion of appendix testis     MEDICATIONS: As above     ALLERGIES: Allergies  Allergen Reactions   Nortriptyline Other (See Comments)    Renal failure per pt- he told Dr Maple Hudson that he could not pee? Prostrate issues.  Problems with kidneys and liver    Alcohol Other (See Comments)    Blackouts/fainting   Alcohol-Sulfur [Elemental Sulfur]    Clonazepam    Topiramate Other (See Comments)    Erectile dysfunction    FAMILY HISTORY: Family History  Problem Relation Age of Onset   Cancer Other    Diabetes Other    Hypertension Other    Hypertension Mother    Anemia Mother    Dementia Father    Tremor Father       Objective:  *** General: No acute distress.  Patient  appears well-groomed.   Head:  Normocephalic/atraumatic Eyes:  Fundi examined but not visualized Neck: supple, no paraspinal tenderness, full range of motion Heart:  Regular rate and rhythm Neurological Exam: alert and oriented to person, place, and time.  Speech fluent and not dysarthric, language intact.  CN II-XII intact. Bulk and tone normal, muscle strength 5/5 throughout.   Slight head tremor and resting and kinetic tremor in both hands  Sensation to light touch intact.  Deep tendon reflexes 2+ throughout.  Finger to nose testing intact.  Gait normal, Romberg negative.   Victor Millet, DO  CC: Benedetto Goad, MD

## 2022-10-13 ENCOUNTER — Ambulatory Visit: Payer: Medicare Other | Admitting: Neurology

## 2022-10-13 ENCOUNTER — Encounter: Payer: Self-pay | Admitting: Neurology

## 2022-10-13 VITALS — BP 134/72 | HR 72 | Ht 72.0 in | Wt 256.4 lb

## 2022-10-13 DIAGNOSIS — G43009 Migraine without aura, not intractable, without status migrainosus: Secondary | ICD-10-CM

## 2022-10-13 DIAGNOSIS — G118 Other hereditary ataxias: Secondary | ICD-10-CM

## 2022-10-13 DIAGNOSIS — G4733 Obstructive sleep apnea (adult) (pediatric): Secondary | ICD-10-CM | POA: Diagnosis not present

## 2022-10-13 NOTE — Patient Instructions (Signed)
-  Continue propranolol 

## 2023-01-08 ENCOUNTER — Other Ambulatory Visit: Payer: Self-pay | Admitting: Neurology

## 2023-05-02 ENCOUNTER — Other Ambulatory Visit: Payer: Self-pay | Admitting: Neurology

## 2023-07-06 ENCOUNTER — Encounter: Payer: Self-pay | Admitting: Neurology

## 2023-10-12 ENCOUNTER — Other Ambulatory Visit: Payer: Self-pay | Admitting: Neurology

## 2023-10-13 ENCOUNTER — Ambulatory Visit: Payer: Medicare Other | Admitting: Neurology

## 2023-10-18 NOTE — Progress Notes (Unsigned)
 NEUROLOGY FOLLOW UP OFFICE NOTE  Victor [name] [id number]  Assessment/Plan:   Migraine without aura, without status migrainosus, not intractable - increase frequency. 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  No recurrent spells with negative neurologic workup. OSA with CPAP   1  Migraine prevention:  Increase propranolol  ER to 120mg  daily.  Caution for dizziness.  Next option would be consider CGRP inhibitor. 2. Acute migraine treatment:  may use OTC analgesics (Tylenol , ibuprofen) but just limit use of pain relievers to no more than 9 days out of the month to prevent risk of rebound or medication-overuse headache. 3. Regarding presumed ulnar neuropathy, advised to not lean on elbows and consider using elbow pads as well.  Advised to buy a wrist splint and place reversed over the left antecubital space to keep elbow from bending. 4.  Follow up 6 months.   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   UPDATE: Migraines increased occur every 2-3 days.  Lasts a couple of hours without treatment or may use ice pack.  Not using analgesics.  For the past month, his 4th and 5th digits in the left hand have been numb.  Some associated pain radiating up the forearm.  Has some neck pain.  Denies leaning on the left elbow.  Cold water aggravates it.    2 falls since last visit.  Uses cane.  Hgb A1c in Feb was 9.8    No spells.  Frequency of abortive medication: none Current NSAIDS:  None Current analgesics: none Current triptans:  none Current ergotamine:  none Current anti-emetic: Promethazine 25 mg Current muscle relaxants:   none Current anti-anxiolytic:  none Current sleep aide: Trazodone 100 mg Current Antihypertensive medications:  propranolol  ER 80mg  daily Current Antidepressant medications:  trazodone PRN for insomnia Current Anticonvulsant medications: Lyrica 200 mg twice daily Current anti-CGRP:  none Current Vitamins/Herbal/Supplements:  none Current Antihistamines/Decongestants:  Zyrtec, Flonase Other therapy:  none   No black out spells.   Caffeine:  1 cup of coffee daily Depression:  No; Anxiety:  No Hydration:  8 to 10 1/2 liter bottles of water daily Other pain:  Chronic pain Sleep hygiene:  His ill father passed away.  He is now sleeping better so he did not follow up with sleep medicine.   HISTORY: He has multiple medical issues.  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  His maternal grandfather had similar symptoms.  He has resting tremor, which was treated with Sinemet. He has prior history of orthostatic hypotension, which was treated with Florinef.  Subsequently discontinued due to HTN and has not had orthostatic hypotension since.   He has chronic pain. He has chronic migraines, described as bi-frontal/occipital, constant dull pressure with episodic severe headaches.  Rarely they are associated with nausea.  No specific aggravating or relieving factors.   Past medications tried include nortriptyline (liver), topiramate, zonisamide , gabapentin, Cymbalta, Depakote, cyproheptadine, verapamil, acetazolamide, venlafaxine  XR 150mg  , and Botox.   He has fatigue and is diagnosed with OSA. He was reportedly found to have possible papilledema on exam by an optometrist. He has headache and endorsed pulsatile tinnitus.  He underwent lumbar puncture on 09/22/16, which demonstrated opening pressure  of 19.6 with no change in gait afterwards.  CSF cell count was 0, protein 76, glucose 74, negative VDRL, negative HSV and  negative cytology.  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  MRV of the head from 01/06/17 showed a 2 cm segment of stenosis in the straight sinus.  He was started on Diamox 250mg  twice daily.  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   He has had multiple MRIs and testing over the years.  MRI of brain with and without contrast from 08/24/12, 07/12/13 and 07/04/15 reportedly showed T2 white matter hyperintensities within the bilateral cerebellar peduncles and mildly increased signal in the pons and posterior body of the corpus callosum, stable since 2011.  MRA of head from 07/12/13 was negative.    MRI of cervical spine without contrast from 02/23/14 showed right paracentral disc bulge at C5-C6 and left paracentral and lateral disc bulge at C6-C7 with no evidence of nerve root impingement.  Vitamin D was 31.5.  B12 in January 2017 was 109.  He was supplemented and repeat testing from that June was 330.  CK was 53.  TSH was 2.43.    To evaluate episode of blackout, he had normal awake and asleep EEG on 11/28/2017 and MRI of brain with and without contrast on 12/11/2017 which again demonstrated stable abnormal signal and volume loss in the middle cerebellar peduncles and pons but no acute intracranial abnormality.   On 03/05/18, he noticed that he lost his sense of taste.  It followed a severe headache.  It lasted almost a week.  He took 20 tramadol.    He reports sense of smell has faded as well.  He denies colds or other upper respiratory infection.  However, he had a flu shot 2 weeks prior to this headache.  MRI of brain with and without contrast on 05/09/18 demonstrated deviated septum but patent nasal cavity, some bilateral frontal sinusitis and stable findings consistent with cerebellar ataxia but no new or acute abnormalities to explain loss of sense of smell.   PAST MEDICAL  HISTORY: Past Medical History:  Diagnosis Date   Anxiety state, unspecified    Attention deficit disorder without mention of hyperactivity    Cerebellar ataxia (HCC)    Essential hypertension, benign    IBS (irritable bowel syndrome)    Obesity, unspecified    Torsion of appendix testis     MEDICATIONS: Current Outpatient Medications on File Prior to Visit  Medication Sig Dispense Refill   cetirizine (ZYRTEC) 10 MG tablet Take 10 mg by mouth daily.     metFORMIN (GLUCOPHAGE-XR) 500 MG 24 hr tablet Take 1,000 mg by mouth 2 (two) times daily.     traZODone (DESYREL) 100 MG tablet Take 100 mg by mouth at bedtime.     Vitamin D, Ergocalciferol, (DRISDOL) 1.25 MG (50000 UNIT) CAPS capsule Vitamin D (Ergocalciferol)     No current facility-administered medications on file prior to visit.       ALLERGIES: Allergies  Allergen Reactions   Nortriptyline Other (See Comments)    Renal failure per pt- he told Dr Brain Cahill that he could not pee? Prostrate issues.  Problems with kidneys and liver    Alcohol Other (See Comments)    Blackouts/fainting   Alcohol-Sulfur [Elemental Sulfur]    Clonazepam    Topiramate Other (See Comments)    Erectile dysfunction    FAMILY HISTORY: Family History  Problem Relation Age of Onset   Cancer Other    Diabetes Other    Hypertension Other    Hypertension Mother    Anemia Mother    Dementia Father    Tremor Father       Objective:  Blood pressure 128/74, pulse 69, resp. rate 18, height 6\' 3"  (1.905 m), weight 258 lb (117 kg), SpO2 97%. General: No acute distress.  Patient appears well-groomed.   Head:  Normocephalic/atraumatic Eyes:  Fundi examined but not visualized Neck: supple, no paraspinal tenderness, full range of motion Heart:  Regular rate and rhythm Neurological Exam: alert and oriented.  Speech fluent and not dysarthric, language intact.  CN II-XII intact. Bulk and tone normal, muscle strength 5/5 throughout.  Slight head and  resting and kinetic cerebellar tremor in both hands.  No bradykinesia.  Sensation to light touch intact.  Deep tendon reflexes 2+ throughout.  Finger to nose testing intact.  Gait slightly ataxic.  Uses cane.  Romberg with sway.  Janne Members, DO  CC: Edger Goody, MD

## 2023-10-19 ENCOUNTER — Encounter: Payer: Self-pay | Admitting: Neurology

## 2023-10-19 ENCOUNTER — Ambulatory Visit: Payer: Medicare Other | Admitting: Neurology

## 2023-10-19 VITALS — BP 128/74 | HR 69 | Resp 18 | Ht 75.0 in | Wt 258.0 lb

## 2023-10-19 DIAGNOSIS — G5622 Lesion of ulnar nerve, left upper limb: Secondary | ICD-10-CM

## 2023-10-19 DIAGNOSIS — G4733 Obstructive sleep apnea (adult) (pediatric): Secondary | ICD-10-CM

## 2023-10-19 DIAGNOSIS — G43009 Migraine without aura, not intractable, without status migrainosus: Secondary | ICD-10-CM | POA: Diagnosis not present

## 2023-10-19 DIAGNOSIS — G119 Hereditary ataxia, unspecified: Secondary | ICD-10-CM | POA: Diagnosis not present

## 2023-10-19 MED ORDER — PROPRANOLOL HCL ER 120 MG PO CP24: 120.0000 mg | ORAL_CAPSULE | Freq: Every day | ORAL | 5 refills | Status: DC

## 2023-10-19 NOTE — Patient Instructions (Addendum)
 For migraines, increase propranolol  ER to 120mg  daily.  Sent new prescription to pharmacy For migraines, may take ibuprofen, but limit use of pain relievers to no more than 9 days out of the month to prevent risk of rebound or medication-overuse headache. For left hand numbness, try not to lean on the elbows, use elbow pads.  Buy a wrist splint and at night, use it reversed over the elbow pit to keep arm straight Follow up 6 months.

## 2023-11-12 ENCOUNTER — Other Ambulatory Visit: Payer: Self-pay | Admitting: Neurology

## 2024-04-13 ENCOUNTER — Other Ambulatory Visit: Payer: Self-pay | Admitting: Neurology

## 2024-05-04 NOTE — Progress Notes (Unsigned)
 NEUROLOGY FOLLOW UP OFFICE NOTE  Victor [name] [id number]  Assessment/Plan:   Migraine without aura, without status migrainosus, not intractable - increase frequency. 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  No recurrent spells with negative neurologic workup. OSA with CPAP   1  Migraine prevention:  propranolol  ER 120mg  daily.  *** 2. Acute migraine treatment:  may use OTC analgesics (Tylenol , ibuprofen) but just limit use of pain relievers to no more than 9 days out of the month to prevent risk of rebound or medication-overuse headache. 3. Regarding presumed ulnar neuropathy, *** 4.  Follow up 6 months.   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   UPDATE: Migraine: Increased propranolol  last visit.  ***  Suspected ulnar neuropathy: In May, he endorsed one month history of numbness involving his 4th and 5th digits with some associated pain radiating up the forearm.  Has some neck pain.  Cold water aggravates it.  He was advised not not to lean on his elbows, consider elbow pads and to keep arms straight when going to sleep at night.  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  Uses cane.     Current medications: Frequency of abortive medication: none Current NSAIDS:  None Current analgesics: none Current triptans:  none Current ergotamine:  none Current anti-emetic: Promethazine 25 mg Current muscle relaxants:  none Current anti-anxiolytic:  none Current sleep aide: Trazodone 100 mg Current Antihypertensive medications:  propranolol  ER 120mg  daily Current Antidepressant medications:   trazodone PRN for insomnia Current Anticonvulsant medications: Lyrica 200 mg twice daily Current anti-CGRP:  none Current Vitamins/Herbal/Supplements:  none Current Antihistamines/Decongestants:  Zyrtec, Flonase Other therapy:  none   No black out spells.   Caffeine:  1 cup of coffee daily Depression:  No; Anxiety:  No Hydration:  8 to 10 1/2 liter bottles of water daily Other pain:  Chronic pain Sleep hygiene:  His ill father passed away.  He is now sleeping better so he did not follow up with sleep medicine.   HISTORY: He has multiple medical issues.  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  His maternal grandfather had similar symptoms.  He has resting tremor, which was treated with Sinemet. He has prior history of orthostatic hypotension, which was treated with Florinef.  Subsequently discontinued due to HTN and has not had orthostatic hypotension since.   He has chronic pain. He has chronic migraines, described as bi-frontal/occipital, constant dull pressure with episodic severe headaches.  Rarely they are associated with nausea.  No specific aggravating or relieving factors.   Past medications tried include nortriptyline (liver), topiramate, zonisamide , gabapentin, Cymbalta, Depakote, cyproheptadine, verapamil, acetazolamide, venlafaxine  XR 150mg  , and Botox.   He has fatigue and is diagnosed with OSA. He was reportedly found to have possible papilledema on exam by an optometrist. He has headache and endorsed pulsatile tinnitus.  He underwent lumbar puncture on 09/22/16, which demonstrated opening pressure of 19.6 with no change in gait afterwards.  CSF cell count was 0, protein 76, glucose 74, negative VDRL, negative HSV and negative cytology.  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  MRV of the head from 01/06/17  showed a 2 cm segment of stenosis in the  straight sinus.  He was started on Diamox 250mg  twice daily.  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   He has had multiple MRIs and testing over the years.  MRI of brain with and without contrast from 08/24/12, 07/12/13 and 07/04/15 reportedly showed T2 white matter hyperintensities within the bilateral cerebellar peduncles and mildly increased signal in the pons and posterior body of the corpus callosum, stable since 2011.  MRA of head from 07/12/13 was negative.    MRI of cervical spine without contrast from 02/23/14 showed right paracentral disc bulge at C5-C6 and left paracentral and lateral disc bulge at C6-C7 with no evidence of nerve root impingement.  Vitamin D was 31.5.  B12 in January 2017 was 109.  He was supplemented and repeat testing from that June was 330.  CK was 53.  TSH was 2.43.      In June 2019, he had blacked out time from 8 AM to 4 PM. He was at home. When he regained awareness, he was sitting in his chair. He did not have incontinence or bite his tongue. He had normal awake and asleep EEG on 11/28/2017 and MRI of brain with and without contrast on 12/11/2017 which again demonstrated stable abnormal signal and volume loss in the middle cerebellar peduncles and pons but no acute intracranial abnormality.   On 03/05/18, he noticed that he lost his sense of taste.  It followed a severe headache.  It lasted almost a week.  He took 20 tramadol.    He reports sense of smell has faded as well.  He denies colds or other upper respiratory infection.  However, he had a flu shot 2 weeks prior to this headache.  MRI of brain with and without contrast on 05/09/18 demonstrated deviated septum but patent nasal cavity, some bilateral frontal sinusitis and stable findings consistent with cerebellar ataxia but no new or acute abnormalities to explain loss of sense of smell.   PAST MEDICAL HISTORY: Past Medical History:  Diagnosis  Date   Anxiety state, unspecified    Attention deficit disorder without mention of hyperactivity    Cerebellar ataxia (HCC)    Essential hypertension, benign    IBS (irritable bowel syndrome)    Obesity, unspecified    Torsion of appendix testis     MEDICATIONS: Current Outpatient Medications on File Prior to Visit  Medication Sig Dispense Refill   cetirizine (ZYRTEC) 10 MG tablet Take 10 mg by mouth daily.     metFORMIN (GLUCOPHAGE-XR) 500 MG 24 hr tablet Take 1,000 mg by mouth 2 (two) times daily.     propranolol  ER (INDERAL  LA) 120 MG 24 hr capsule TAKE 1 CAPSULE BY MOUTH EVERY DAY 90 capsule 0   traZODone (DESYREL) 100 MG tablet Take 100 mg by mouth at bedtime.     Vitamin D, Ergocalciferol, (DRISDOL) 1.25 MG (50000 UNIT) CAPS capsule Vitamin D (Ergocalciferol)     No current facility-administered medications on file prior to visit.       ALLERGIES: Allergies  Allergen Reactions   Nortriptyline Other (See Comments)    Renal failure per pt- he told Dr Leopoldo that he could not pee? Prostrate issues.  Problems with kidneys and liver    Alcohol Other (See Comments)    Blackouts/fainting   Alcohol-Sulfur [Elemental Sulfur]    Clonazepam    Topiramate Other (See Comments)  Erectile dysfunction    FAMILY HISTORY: Family History  Problem Relation Age of Onset   Cancer Other    Diabetes Other    Hypertension Other    Hypertension Mother    Anemia Mother    Dementia Father    Tremor Father       Objective:  *** General: No acute distress.  Patient appears well-groomed.   Head:  Normocephalic/atraumatic Eyes:  Fundi examined but not visualized Neck: supple, no paraspinal tenderness, full range of motion Heart:  Regular rate and rhythm Neurological Exam: alert and oriented.  Speech fluent and not dysarthric, language intact.  CN II-XII intact. Bulk and tone normal, muscle strength 5/5 throughout.  Slight head and resting and kinetic cerebellar tremor in both  hands.  No bradykinesia.  Sensation to light touch intact.  Deep tendon reflexes 2+ throughout.  Finger to nose testing intact.  Gait slightly ataxic.  Uses cane.  Romberg with sway.  Juliene Dunnings, DO  CC: Prentice Blush, MD

## 2024-05-07 ENCOUNTER — Encounter: Payer: Self-pay | Admitting: Neurology

## 2024-05-07 ENCOUNTER — Ambulatory Visit: Admitting: Neurology

## 2024-05-07 VITALS — BP 124/50 | HR 76 | Ht 73.0 in | Wt 258.2 lb

## 2024-05-07 DIAGNOSIS — G119 Hereditary ataxia, unspecified: Secondary | ICD-10-CM | POA: Diagnosis not present

## 2024-05-07 DIAGNOSIS — G43009 Migraine without aura, not intractable, without status migrainosus: Secondary | ICD-10-CM

## 2024-05-07 DIAGNOSIS — G252 Other specified forms of tremor: Secondary | ICD-10-CM | POA: Diagnosis not present

## 2024-05-07 MED ORDER — PROPRANOLOL HCL ER 120 MG PO CP24: 120.0000 mg | ORAL_CAPSULE | Freq: Every day | ORAL | 2 refills | Status: AC

## 2024-05-07 MED ORDER — PRIMIDONE 50 MG PO TABS: 25.0000 mg | ORAL_TABLET | Freq: Every day | ORAL | 0 refills | Status: DC

## 2024-05-07 NOTE — Patient Instructions (Signed)
 For tremor, start primidone  - take 1/2 tablet at bedtime.  If no improvement in 4 weeks, contact me and we can increase dose Continue propranolol  ER 120mg  daily Keep elbows straight Follow up 1 year

## 2024-05-29 ENCOUNTER — Telehealth: Payer: Self-pay | Admitting: Neurology

## 2024-05-29 MED ORDER — PRIMIDONE 50 MG PO TABS: 100.0000 mg | ORAL_TABLET | Freq: Every day | ORAL | 0 refills | Status: DC

## 2024-05-29 NOTE — Telephone Encounter (Signed)
 Patient advised of Dr.Jaffe note, I would increase primidone  to 100mg  at bedtime. Please send in in prescription for 50mg  tablet - take 2 tablets at bedtime.

## 2024-05-29 NOTE — Telephone Encounter (Signed)
 Pt called in this afternoon and he  stated  that the primidone  (MYSOLINE ) 50 MG tablet is  not working for  his tremors at all. Pt wants to know what will be the next step to try to control the tremors. Thanks

## 2024-07-10 ENCOUNTER — Other Ambulatory Visit: Payer: Self-pay | Admitting: Neurology

## 2025-05-07 ENCOUNTER — Ambulatory Visit: Admitting: Neurology
